# Patient Record
Sex: Female | Born: 1940
Health system: Southern US, Community
[De-identification: ages and names within clinical notes are randomized; demographics above are authoritative.]

## PROBLEM LIST (undated history)

## (undated) DIAGNOSIS — R011 Cardiac murmur, unspecified: Secondary | ICD-10-CM

## (undated) DIAGNOSIS — D509 Iron deficiency anemia, unspecified: Secondary | ICD-10-CM

## (undated) DIAGNOSIS — H269 Unspecified cataract: Secondary | ICD-10-CM

## (undated) DIAGNOSIS — T7840XA Allergy, unspecified, initial encounter: Secondary | ICD-10-CM

## (undated) DIAGNOSIS — I10 Essential (primary) hypertension: Secondary | ICD-10-CM

## (undated) DIAGNOSIS — M1990 Unspecified osteoarthritis, unspecified site: Secondary | ICD-10-CM

## (undated) DIAGNOSIS — M810 Age-related osteoporosis without current pathological fracture: Secondary | ICD-10-CM

## (undated) DIAGNOSIS — E785 Hyperlipidemia, unspecified: Secondary | ICD-10-CM

## (undated) HISTORY — DX: Essential (primary) hypertension: I10

## (undated) HISTORY — DX: Cardiac murmur, unspecified: R01.1

## (undated) HISTORY — DX: Iron deficiency anemia, unspecified: D50.9

## (undated) HISTORY — DX: Unspecified cataract: H26.9

## (undated) HISTORY — PX: COLONOSCOPY: SHX174

## (undated) HISTORY — DX: Allergy, unspecified, initial encounter: T78.40XA

## (undated) HISTORY — DX: Hyperlipidemia, unspecified: E78.5

## (undated) HISTORY — PX: TONSILLECTOMY: SUR1361

## (undated) HISTORY — PX: COSMETIC SURGERY: SHX468

## (undated) HISTORY — DX: Age-related osteoporosis without current pathological fracture: M81.0

## (undated) HISTORY — DX: Unspecified osteoarthritis, unspecified site: M19.90

## (undated) HISTORY — PX: TOTAL KNEE ARTHROPLASTY: SHX125

## (undated) HISTORY — PX: CATARACT EXTRACTION, BILATERAL: SHX1313

---

## 1999-07-16 ENCOUNTER — Other Ambulatory Visit: Admission: RE | Admit: 1999-07-16 | Discharge: 1999-07-16 | Payer: Self-pay | Admitting: Obstetrics and Gynecology

## 2000-07-22 ENCOUNTER — Other Ambulatory Visit: Admission: RE | Admit: 2000-07-22 | Discharge: 2000-07-22 | Payer: Self-pay | Admitting: Obstetrics and Gynecology

## 2001-09-14 ENCOUNTER — Other Ambulatory Visit: Admission: RE | Admit: 2001-09-14 | Discharge: 2001-09-14 | Payer: Self-pay | Admitting: Obstetrics and Gynecology

## 2003-11-19 DIAGNOSIS — E785 Hyperlipidemia, unspecified: Secondary | ICD-10-CM

## 2003-11-19 HISTORY — DX: Hyperlipidemia, unspecified: E78.5

## 2006-04-07 ENCOUNTER — Inpatient Hospital Stay (HOSPITAL_COMMUNITY): Admission: RE | Admit: 2006-04-07 | Discharge: 2006-04-10 | Payer: Self-pay | Admitting: Orthopedic Surgery

## 2006-06-18 ENCOUNTER — Ambulatory Visit (HOSPITAL_COMMUNITY): Admission: RE | Admit: 2006-06-18 | Discharge: 2006-06-18 | Payer: Self-pay | Admitting: Orthopedic Surgery

## 2006-12-24 ENCOUNTER — Inpatient Hospital Stay (HOSPITAL_COMMUNITY): Admission: RE | Admit: 2006-12-24 | Discharge: 2006-12-27 | Payer: Self-pay | Admitting: Orthopedic Surgery

## 2009-04-25 ENCOUNTER — Encounter: Admission: RE | Admit: 2009-04-25 | Discharge: 2009-04-25 | Payer: Self-pay | Admitting: Obstetrics and Gynecology

## 2010-05-31 IMAGING — CR DG THORACOLUMBAR SPINE 2V
4 series · 4 of 4 positions shown · non-contrast
Comparison: Chest x-ray 03/31/2006

CLINICAL DATA: Question moderate wedge fracture.  Possible abnormal
bone density scan.

THORACOLUMBAR SPINE - 2 VIEW

[view not recorded (1 of 4)]
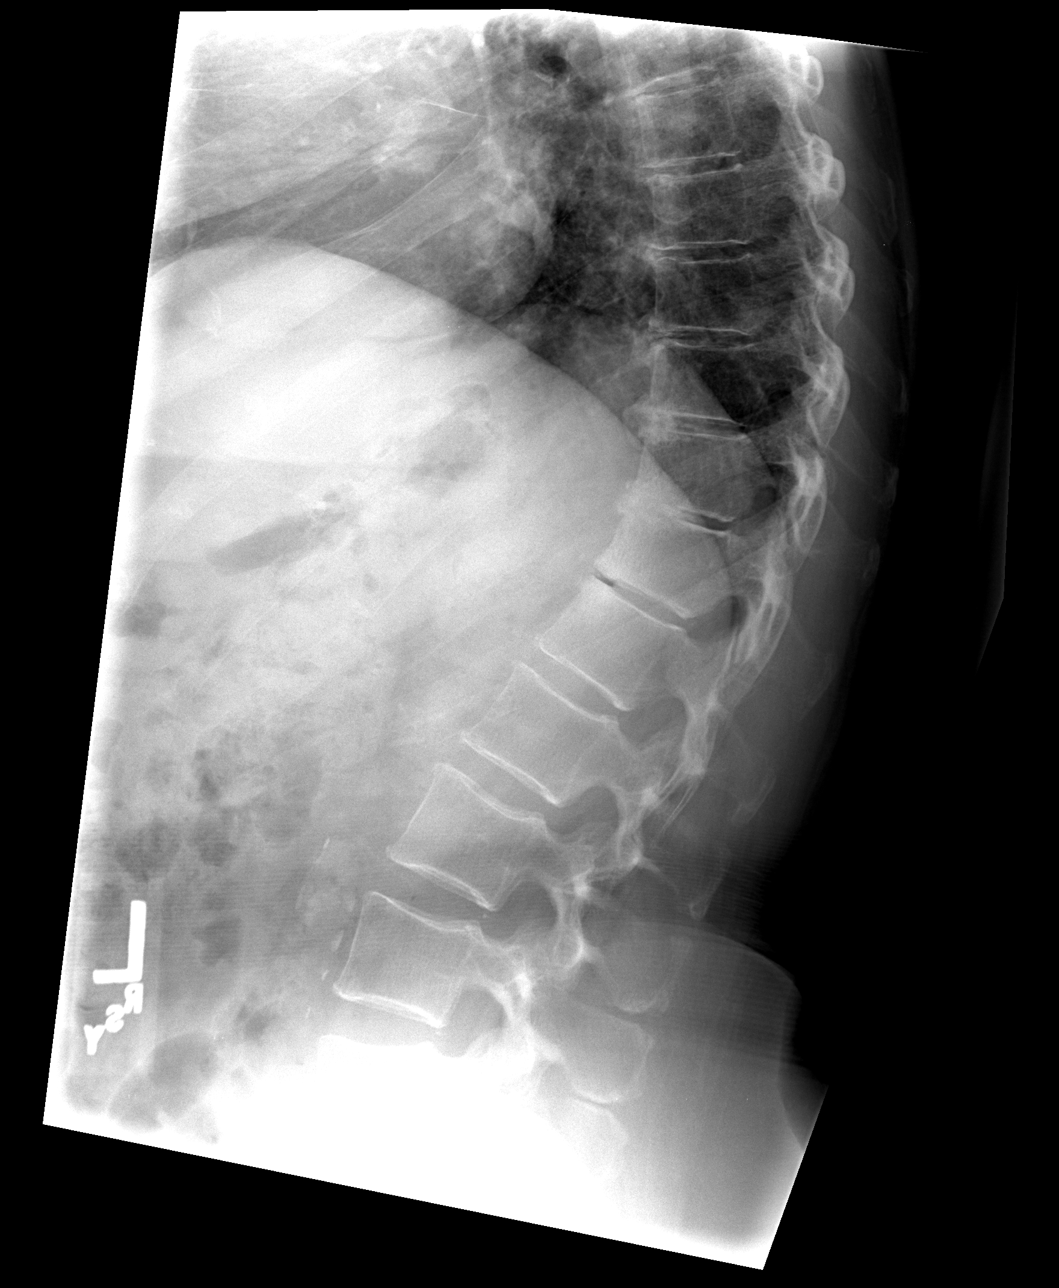

[view not recorded (2 of 4)]
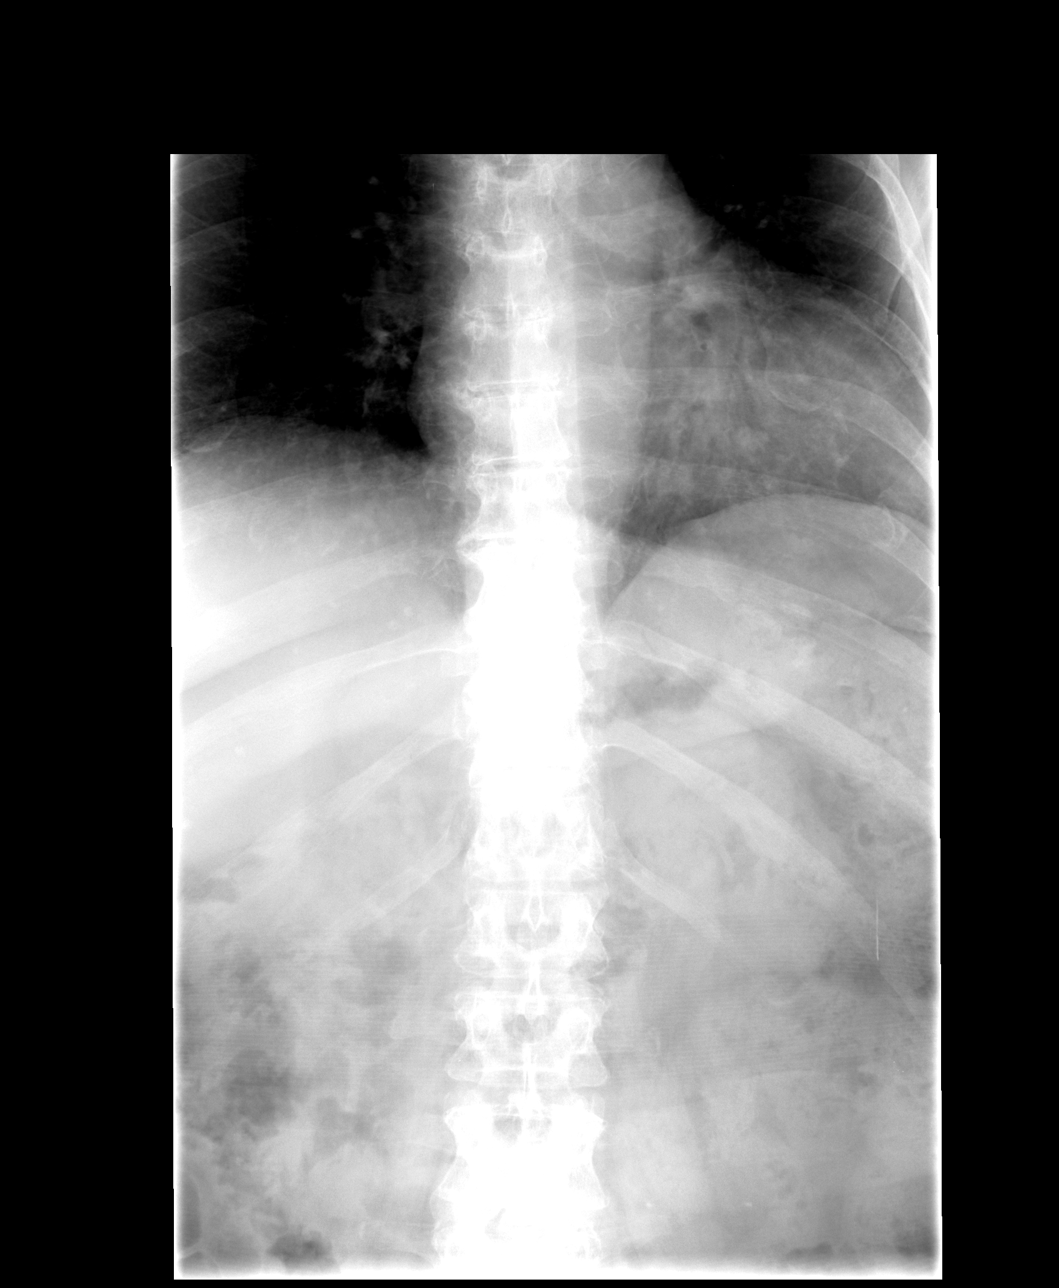

[view not recorded (3 of 4)]
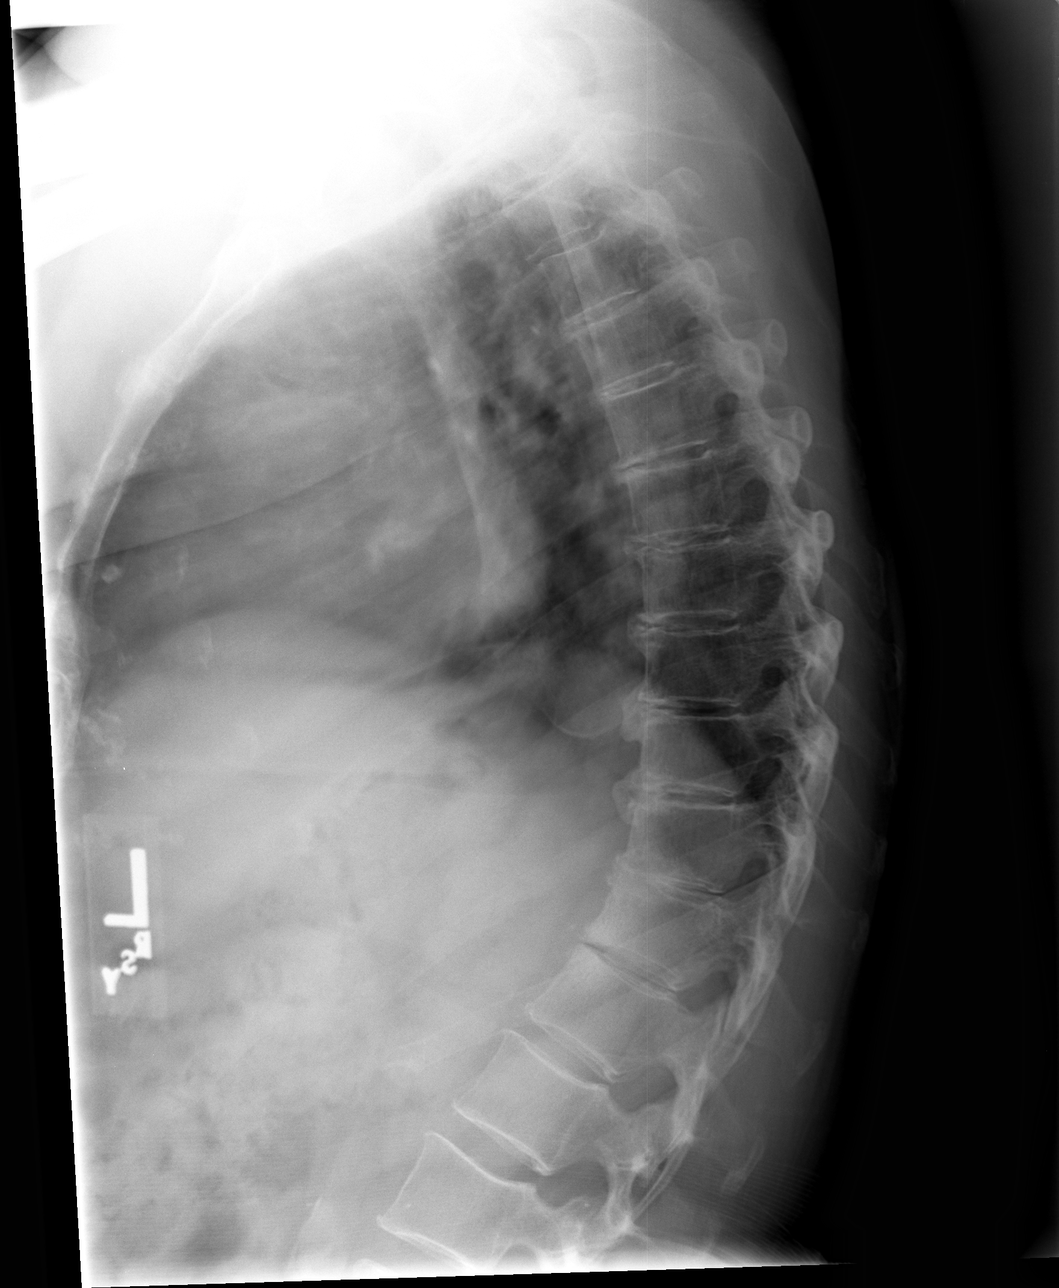

[view not recorded (4 of 4)]
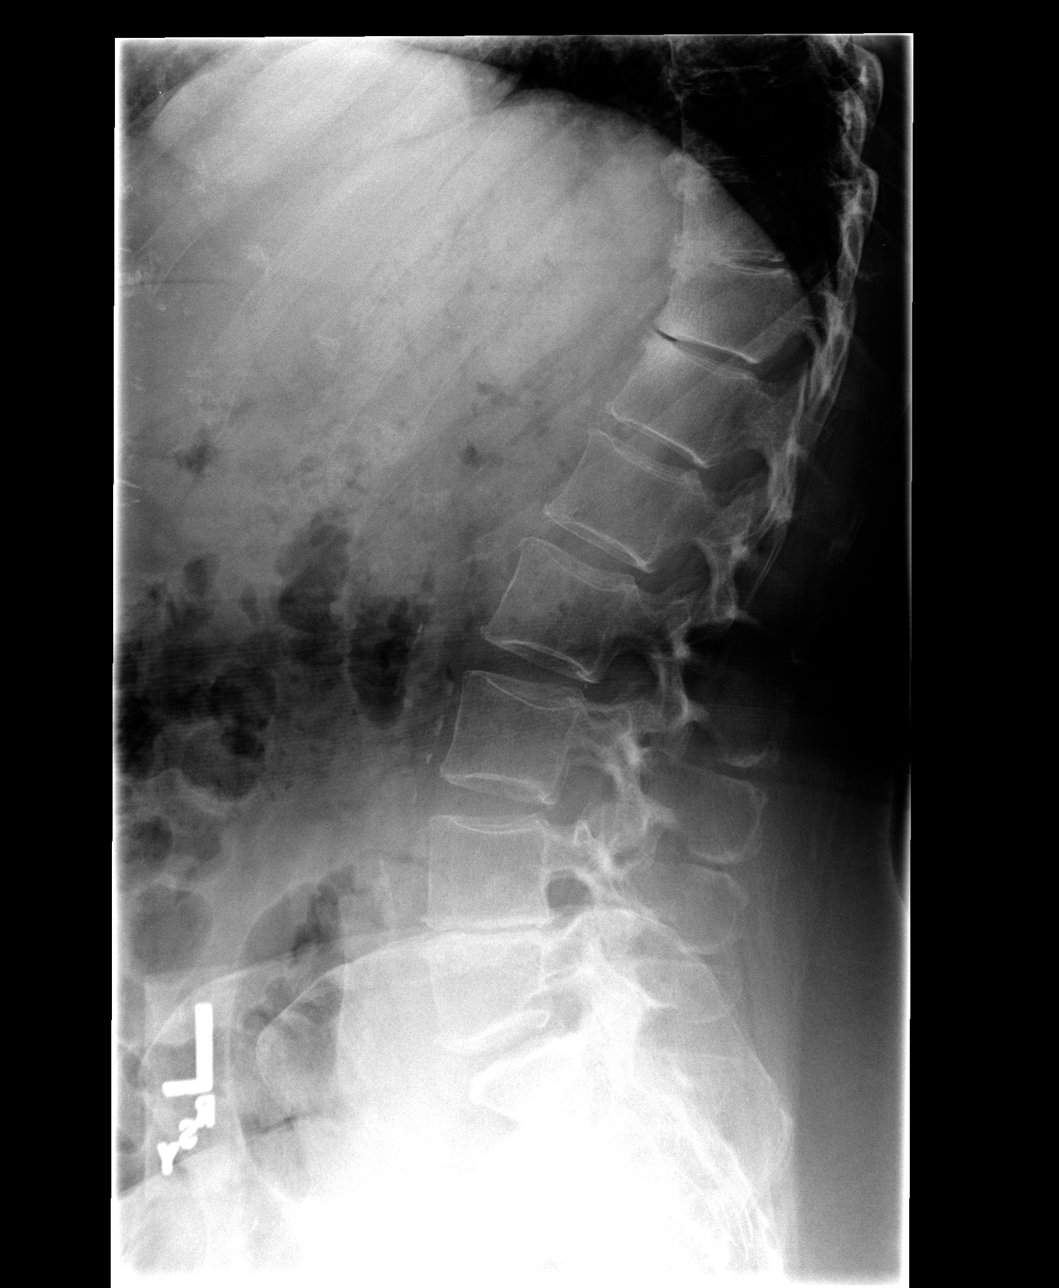

[4 of 4 positions shown; findings below may reference images not displayed]

FINDINGS: Numbering of the thoracic spine was accomplished by
counting from the sacrum.  There is significant disc space
narrowing at L4-5.  There is no evidence for lower thoracic
compression fracture.  There is moderate anterior osteophyte
formation at T9-10, T10-11, and to a lesser degree at T11-12.  No
destructive lesion or acute/chronic compression fracture is seen.
The appearance is not significantly changed from prior chest x-ray.
IMPRESSION: No evidence for T11 compression fracture.

## 2010-11-18 DIAGNOSIS — H269 Unspecified cataract: Secondary | ICD-10-CM

## 2010-11-18 HISTORY — DX: Unspecified cataract: H26.9

## 2011-04-05 NOTE — H&P (Signed)
[name], [name]              ACCOUNT NO.:  [id number]   MEDICAL RECORD NO.:  [id number]          PATIENT TYPE:  INP   LOCATION:  NA                           FACILITY:  [facility]   PHYSICIAN:  Ollen Gross, M.D.    DATE OF BIRTH:  Dec 29, 1940   DATE OF ADMISSION:  12/24/2006  DATE OF DISCHARGE:                              HISTORY & PHYSICAL   Date of office visit, history and physical December 16, 2006.   CHIEF COMPLAINT:  Right knee pain.   HISTORY OF PRESENT ILLNESS:  The patient is a 70 year old female who has  been seen by Dr. Homero Fellers Aluisio for ongoing knee pain.  She has had a  previous left total knee done back in May of 2007 which did require  manipulation.  The knee is getting better.  Unfortunately, the right  knee continues to be progressive with known osteoarthritis.  It was felt  she has reached a point where she would benefit from undergoing knee  replacement.  Risks and benefits discussed.  The patient was  subsequently admitted to the hospital.   ALLERGIES:  PENICILLIN and KEFLEX causes rash.   CURRENT MEDICATIONS:  Lipitor, hydrochlorothiazide, Colace.  She also  takes fiber, baby aspirin, calcium, fish oil, vitamin D, Alli.   PAST MEDICAL HISTORY:  1. Hypertension.  2. Osteoarthritis.  3. Postmenopausal.   PAST SURGICAL HISTORY:  1. Left total knee in May of 2007.  2. Closed manipulation of left total knee.  3. Extensive plastic surgery on face secondary to accident in 1947.  4. Wisdom teeth extraction and tonsils.   SOCIAL HISTORY:  Married.  Works as a IT consultant.  Clinical research associate.  Two  glasses of wine per week.  Two children.   FAMILY HISTORY:  Mother with a history of hypertension, hypothyroidism,  arthritis.  Grandparents with a history of heart disease, diabetes,  stroke, arthritis.  Father with a history of osteoporosis and probable  history of osteoporosis and pneumonia.  History of one aunt with colon  cancer and another aunt with breast  cancer.   REVIEW OF SYSTEMS:  GENERAL:  No fevers, chills or night sweats.  NEURO:  No seizures, syncope or paralysis.  RESPIRATORY:  No shortness of  breath, productive cough or hemoptysis.  CARDIOVASCULAR:  No chest pain,  angina, orthopnea.  GI:  No nausea, vomiting, diarrhea or constipation.  GU:  No dysuria, hematuria or discharge.  MUSCULOSKELETAL:  Right knee.   PHYSICAL EXAMINATION:  VITAL SIGNS:  Pulse 72, respirations 14, blood  pressure 140/78.  GENERAL:  A 70 year old white female well-developed, well-nourished in  no acute distress.  Short in stature, good historian.  Alert, oriented  and cooperative.  HEENT:  Normocephalic and atraumatic.  Pupils round and reactive.  Noted  to wear glasses.  EOMs intact.  Neck supple.  No bruits.  CHEST:  Clear anterior and posterior chest wall.  No rhonchi, rubs or  wheezing.  HEART:  Regular rate and rhythm with occasional ectopic beat.  S1-S2  noted.  ABDOMEN:  Soft, slightly round.  Bowel sounds present.  RECTAL/BREASTS/GENITALIA:  Not done, not  pertinent to present illness.  EXTREMITIES:  Right knee shows passive range of motion of 5 to 120,  marked crepitus, no effusion.   IMPRESSION:  1. Osteoarthritis right knee.  2. Hypertension.  3. Osteoarthritis.  4. Postmenopausal.   PLAN:  The patient is admitted to [facility] to undergo a  right total knee arthroplasty.  Her medical physician, Dr. Smith Mince,  will be notified of the room and should be consulted if needed for  medical assistance with the patient throughout the hospital course.  She  has been seen preoperatively by Dr. Smith Mince and felt to be medically  stable for surgery.      [name], P.A.      Ollen Gross, M.D.  Electronically Signed    ALP/MEDQ  D:  12/23/2006  T:  12/24/2006  Job:  [id number]   cc:   Ollen Gross, M.D.  Fax: [phone]   Talmadge Coventry, M.D.  Fax: [phone]   Patient's chart

## 2011-04-05 NOTE — H&P (Signed)
NAMEPAYTEN, HOBIN              ACCOUNT NO.:  [id number]   MEDICAL RECORD NO.:  [id number]          PATIENT TYPE:  INP   LOCATION:  NA                           FACILITY:  [facility]   PHYSICIAN:  Ollen Gross, M.D.    DATE OF BIRTH:  1941/03/08   DATE OF ADMISSION:  04/07/2006  DATE OF DISCHARGE:                                HISTORY & PHYSICAL   CHIEF COMPLAINT:  Bilateral knee pain.   HISTORY OF PRESENT ILLNESS:  The patient is a 70 year old female who has  been seeing Dr. Lequita Halt for ongoing bilateral knee pain.  The left is more  symptomatic and problematic than the right.  She has had difficulty for  several years now and is interfering with activities she desires to do.  She  is at a stage now when she would like to have something more definitive done  about her knee.  She has been seen in the office. She has significant  arthritic changes in both with bone-on-bone knee patellofemoral  compartments.  Varus deformities are noted bilaterally.  Both are equally  affected, but the left tends to be more problematic and symptomatic.  It was  felt she would benefit from undergoing surgical intervention.  Risks and  benefits discussed.  The patient is subsequently admitted to the hospital .   ALLERGIES:  PENICILLIN and KEFLEX cause rash.   CURRENT MEDICATIONS:  1.  Hydrochlorothiazide 25 mg.  2.  Lipitor 10 mg.  3.  Aspirin 81 mg.  4.  Calcium 1500 mg.   PAST MEDICAL HISTORY:  1.  Hypertension.  2.  Osteoarthritis.  3.  Postmenopausal.   PAST SURGICAL HISTORY:  1.  Extensive facial plastic surgery following accident in 1947.  2.  Tonsillectomy.  3.  Wisdom teeth extraction.   SOCIAL HISTORY:  Married, works as a IT consultant.  Clinical research associate.  Two glasses of  wine per week.  Two children.   FAMILY HISTORY:  Mother with history of hypertension, hypothyroidism, and  arthritis.  Grandparents with history of heart disease, diabetes, stroke,  arthritis.  Father with history of  osteoporosis and deceased secondary to  pneumonia.  Also history of one aunt with colon cancer, another aunt with  breast cancer.   REVIEW OF SYSTEMS:  GENERAL: No fevers, chills, night sweats.  NEUROLOGIC:  No seizures, syncope, paralysis. RESPIRATORY: No shortness of breath,  productive cough, or hemoptysis.  CARDIOVASCULAR: No chest pain, angina,  orthopnea. GI: No nausea, vomiting, diarrhea, constipation. GU: No dysuria,  hematuria, or discharge. MUSCULOSKELETAL: Bilateral knees.   PHYSICAL EXAMINATION:  VITAL SIGNS:  Pulse 76, respirations 16, blood  pressure 152/90.  GENERAL: A 70 year old female, well-nourished, well-developed, short  stature, no acute distress.  She is alert, oriented, cooperative.  HEENT:  Normocephalic and atraumatic.  Pupils equal, round, and reactive to  light.  Oropharynx clear. EOM intact.  NECK: Supple.  CHEST: Clear.  HEART: Regular rate and rhythm without murmur.  ABDOMEN:  Soft, nontender.  Bowel sounds present.  RECTAL/BREASTS/GENITALIA: Not done, not pertinent to present illness.  EXTREMITIES: Left knee: Left knee shows no effusion.  Range  of motion 10 to  120 degrees.  Varus malalignment deformity.  Marked crepitus is noted.   IMPRESSION:  1.  Osteoarthritis bilateral knees, left more symptomatic than right.  2.  Hypertension.  3.  Postmenopausal.   PLAN:  The patient will be admitted to [facility] and undergo a  left total knee angioplasty.  Surgery will be performed by Dr. Ollen Gross.      Alexzandrew L. Julien Girt, P.A.      Ollen Gross, M.D.  Electronically Signed    ALP/MEDQ  D:  04/06/2006  T:  04/06/2006  Job:  [id number]   cc:   Talmadge Coventry, M.D.  Fax: [phone]

## 2011-04-05 NOTE — Discharge Summary (Signed)
NAMEDEVERY, ODWYER              ACCOUNT NO.:  [id number]   MEDICAL RECORD NO.:  [id number]          PATIENT TYPE:  INP   LOCATION:  1503                         FACILITY:  [facility]   PHYSICIAN:  Ollen Gross, M.D.    DATE OF BIRTH:  June 24, 1941   DATE OF ADMISSION:  04/07/2006  DATE OF DISCHARGE:  04/10/2006                                 DISCHARGE SUMMARY   ADMITTING DIAGNOSES:  1.  Osteoarthritis bilateral knees, left more symptomatic than the right.  2.  Hypertension.  3.  Post menopausal.   DISCHARGE DIAGNOSES:  1.  Osteoarthritis, left knee status post left total knee arthroplasty.  2.  Postoperative blood loss anemia.  3.  Postoperative hypokalemia, improved.  4.  Hypertension.  5.  Post menopausal.   PROCEDURE:  On Apr 07, 2006, left total knee surgery, Dr. Lequita Halt.  Assistant:  Alexzandrew L. Julien Girt, P.A.  Spinal anesthesia.   CONSULTATIONS:  None.   BRIEF HISTORY:  Ms. Kroeker is a 70 year old female with end stage  arthritis both knees, left more symptomatic than right, failed nonoperative  management, now presents for total knee.   LABORATORY DATA:  Pre-op CBC:  Hemoglobin 13.1, hematocrit 39.0, normal  white count.  Post-op hemoglobin dropped down to 10.4 with drift down to  9.9.  It came back up with last known H&H 10.2 and 29.  PT PTT pre-op 12.6  and 27, respectively.  INR 0.9.  Serial pro time followed, last known PT INR  20.3 and 1.7.  Chem panel on admission all within normal limits.  Potassium  started at 3.9, drifted down to 3.4, back up to 3.7.  Pre-op UA:  Some small  leukocyte esterase, 0-2 white cells, otherwise negative.  Blood group type  B+.  Two-view chest, Mar 31, 2006, no acute cardiopulmonary disease.   EKG, faxed over May 2007, sinus rhythm, left atrial abnormality, per Dr.  Talmadge Coventry.   HOSPITAL COURSE:  The patient was admitted to [facility],  tolerated procedure well, later transferred to the recovery room on the  orthopedic floor, started on PCA and p.o. analgesics, doing pretty well in  the morning of day 1.  Encouraged pills.  Hemovac drain was pulled.  Started  to get up out of bed with therapy to a chair.  By day 2, she was doing a  little bit better.  Started getting up and ambulating approximately 30 feet  that morning and then eventually 200 feet that afternoon, had an extreme  drop in her potassium likely due to fluids, given some potassium  supplementation, came right back up.  Dressing was changed on day 2.  Incision looked good.  Did so well and she was progressing with therapy by  Apr 10, 2006, and was ready to go home.   DISPOSITION:  Patient discharged home on Apr 10, 2006.   DISCHARGE DIAGNOSES:  1.  Osteoarthritis, left knee status post left total knee arthroplasty.  2.  Postoperative blood loss anemia.  3.  Postoperative hypokalemia, improved.  4.  Hypertension.  5.  Post menopausal.   DISCHARGE MEDICATIONS:  Coumadin,  Percocet, Robaxin.   DIET:  As tolerated.   ACTIVITY:  Home health PT  and home health nursing.  Weightbearing as  tolerated, total knee protocol.   DISPOSITION:  Home.   CONDITION ON DISCHARGE:  Improved.      Alexzandrew L. Julien Girt, P.A.      Ollen Gross, M.D.  Electronically Signed    ALP/MEDQ  D:  05/20/2006  T:  05/20/2006  Job:  [id number]   cc:   Ollen Gross, M.D.  Fax: [phone]   chart   Talmadge Coventry, M.D.  Fax: [phone]

## 2011-04-05 NOTE — Op Note (Signed)
[name], [name]              ACCOUNT NO.:  [id number]   MEDICAL RECORD NO.:  [id number]          PATIENT TYPE:  INP   LOCATION:  0002                         FACILITY:  [facility]   PHYSICIAN:  Ollen Gross, M.D.    DATE OF BIRTH:  10/04/1941   DATE OF PROCEDURE:  12/24/2006  DATE OF DISCHARGE:                               OPERATIVE REPORT   PREOPERATIVE DIAGNOSIS:  Osteoarthritis right knee.   POSTOPERATIVE DIAGNOSIS:  Osteoarthritis right knee.   PROCEDURE:  Right total knee arthroplasty.   SURGEON:  Dr. Homero Fellers Aluisio.   ASSISTANT:  Avel Peace, PA-C.   ANESTHESIA:  Spinal.   BLOOD LOSS:  Minimal.   DRAINS:  Hemovac x1.   TOURNIQUET TIME:  38 minutes at 300 mmHg.   COMPLICATIONS:  None.   CONDITION:  Stable to recovery.   BRIEF CLINICAL NOTE:  [name] is a 70 year old female with end-stage  arthritis of the right knee.  She has had progressively worsening pain  and dysfunction.  She has had a successful left total knee arthroplasty  and presents now for right total knee arthroplasty.   PROCEDURE IN DETAIL:  After successful administration of spinal  anesthetic, a tourniquet was placed high on the right thigh and right  lower extremity prepped and draped in the usual sterile fashion.  Extremity was wrapped in Esmarch, knee flexed, tourniquet inflated to  300 mmHg.  A midline incision was made with a 10 blade through the  subcutaneous tissue to the level of the extensor mechanism. A fresh  blade was used to make a medial parapatellar arthrotomy.  The soft  tissue over the proximal medial tibia is subperiosteally elevated to the  joint line with a knife and into the semimembranosus bursa with a Cobb  elevator.  The soft tissue laterally was elevated with attention being  paid to avoiding the patellar tendon on the tibial tubercle.  The  patella is subluxed laterally, knee flexed 90 degrees, ACL and PCL  removed.  A drill was used to create a starting hole and the  distal  femoral canal was thoroughly irrigated. A 5 degree right valgus  alignment guide is placed and referencing off the posterior condyles  rotation is marked and the block pinned to remove 11 mm off the distal  femur.  Distal femoral resection is made with an oscillating saw. A  sizing block is placed, size 3 is the most appropriate.  Rotation is  marked off the epicondylar axis and then the size 3 cutting block  placed.  The anterior-posterior and chamfer cuts are made.   The tibia is subluxed forward and the menisci are removed.  The  extramedullary tibial alignment guide is placed referencing proximally  at the medial aspect of the tibial tubercle and distally along the  second metatarsal axis and tibial crest.  The block is pinned to remove  10 mm off the non deficient lateral side.  Tibial resection is made with  an oscillating saw. Size 2.5 is the most appropriate tibial component  and then the proximal tibia prepared with the modular drill and keel  punch  for size a 2.5.  Femoral preparation is completed with the  intercondylar cut.   A size 2.5 mobile bearing tibial trial with a 2.5 posterior stabilized  femoral trial and a 10 mm posterior stabilized rotating platform insert  trial is placed.  With the 10, we had a slight bit of hyperextension and  a tiny bit of varus and valgus play. We went up to 12.5 which allowed  for full extension with excellent varus and valgus and anterior and  posterior balance throughout full range of motion.  The patella was then  everted and thickness measured to be 22 mm.  Free hand resection was  taken to 12 mm, 35 template is placed, lug holes are drilled, trial  patella is placed and it tracks normally.  Osteophytes are removed off  the posterior femur with a trial in place.  All trials are removed and  the cut bone surfaces prepared with pulsatile lavage.  Cement is mixed  and once ready for implantation, the size 2.5 mobile bearing tibial   tray, size 3 posterior stabilized femur and 35 patella are cemented into  place and patella is held with a clamp.  A trial 12.5 insert is placed,  knee held in full extension and all extruded cement removed.  Once the  cement is fully hardened then the permanent 12.5 mm posterior stabilized  rotating platform insert is placed into the tibial tray.  The wound was  copiously irrigated with saline solution and the extensor mechanism  closed over a Hemovac drain with interrupted #1 PDS.  Flexion against  gravity is 135 degrees.  The tourniquet is released with a total time of  38 minutes. The subcu was closed with interrupted 2-0 Vicryl and  subcuticular with running 4-0 Monocryl. The drain is hooked to suction.  Incision cleaned and dried and Steri-Strips and a bulky sterile dressing  applied.  She is then awakened and transported to recovery in stable  condition with right knee in the knee immobilizer.      Ollen Gross, M.D.  Electronically Signed     FA/MEDQ  D:  12/24/2006  T:  12/24/2006  Job:  [id number]

## 2011-04-05 NOTE — Discharge Summary (Signed)
[name], [name]              ACCOUNT NO.:  [id number]   MEDICAL RECORD NO.:  [id number]          PATIENT TYPE:  INP   LOCATION:  1509                         FACILITY:  [facility]   PHYSICIAN:  Ollen Gross, M.D.    DATE OF BIRTH:  11-27-1940   DATE OF ADMISSION:  12/24/2006  DATE OF DISCHARGE:  12/27/2006                               DISCHARGE SUMMARY   ADMITTING DIAGNOSES:  1. Osteoarthritis right knee.  2. Hypertension.  3. Osteoarthritis.  4. Postmenopausal.   DISCHARGE DIAGNOSES:  1. Osteoarthritis right knee status post right total knee      arthroplasty.  2. Mild postoperative blood loss anemia.  3. Hypertension.  4. Osteoarthritis.  5. Postmenopausal.   PROCEDURE:  February 6. 2008:  Right total knee.  Surgeon:  Dr. Lequita Halt.  Assistant:  Selena Peace, PA-C.  Performed under spinal anesthesia.   CONSULTS:  None.   BRIEF HISTORY:  [name] is a 70 year old female with end-stage arthritis  of the right knee.  She has had progressive worsening pain and  dysfunction, successful left total knee, and now presents for right  total knee.   LABORATORY DATA:  Preoperative CBC showed hemoglobin of 12.3, hematocrit  of 39.2, and white cell count 5.9.  Chem panel all within normal limits.  Preoperative EKG shows sinus rhythm, left atrial enlargement, confirmed  normal.  Chest x-ray dated Mar 31, 2006:  No active cardiopulmonary  disease.  Serial CBCs were followed.  Postoperative hemoglobin down to  10.3.  Last noted H&H 9.3 and 26.6.  PT/PTT on admission 12.5 and 28  respectively.  INR 0.9.  Serial pro times followed.  Last noted PT/INR  21.1 and 1.7. Serial BMETs were followed.  Electrolytes remained within  normal limits.  Preoperative UA:  Large leukocyte esterase, rare  epithelials, 3-6 white cells and rare bacteria.  Blood group/type B  positive.   HOSPITAL COURSE:  The patient was admitted to [facility].  Taken to the OR and underwent above-stated procedure  without  complication.  Tolerated procedure well, later transferred to the  recovery room and then the orthopedic floor.  She did pretty well  postoperatively, was able to get some sleep on the night of surgery.  She was doing pretty well on the morning of day #1, seen in rounds by  Dr. Lequita Halt.  Hemovac drain placed at the time of surgery was pulled  without difficulty.  She had excellent urinary output, had a little bit  elevated serum glucose which was felt to be due to the D5; that was  removed.  Started getting up out of bed with physical therapy.  By day  #2 she was doing really well.  She was in better spirits, looked  comfortable.  Hemoglobin was down to 9.4 so she was placed on some iron.  Dressing was changed.  Incision looked good.  She actually did very well  with physical therapy and got up to ambulating approximately 200 feet.  She was so well she was seen in rounds by the orthopedic weekend  coverage, tolerating her medications, and was discharged home the  following day.   DISCHARGE PLAN:  1. The patient discharged home on December 27, 2006.  2. Discharge diagnoses:  Please see above.  3. Discharge medications:  Percocet, Robaxin, Coumadin.  4. Diet:  Resume home diet.  5. Activity:  Weightbearing as tolerated.  Total knee protocol.  Home      health PT, home health nursing.  6. Followup 2 weeks.   DISPOSITION:  Home.   CONDITION UPON DISCHARGE:  Improved.      Alexzandrew L. Julien Girt, P.A.      Ollen Gross, M.D.  Electronically Signed    ALP/MEDQ  D:  01/29/2007  T:  01/30/2007  Job:  [id number]   cc:   Talmadge Coventry, M.D.  Fax: [phone]

## 2011-04-05 NOTE — Op Note (Signed)
NAMESIMRA, FIEBIG              ACCOUNT NO.:  [id number]   MEDICAL RECORD NO.:  [id number]          PATIENT TYPE:  INP   LOCATION:  0006                         FACILITY:  [facility]   PHYSICIAN:  Ollen Gross, M.D.    DATE OF BIRTH:  February 18, 1941   DATE OF PROCEDURE:  04/07/2006  DATE OF DISCHARGE:                                 OPERATIVE REPORT   PREOPERATIVE DIAGNOSIS:  Osteoarthritis left knee.   POSTOPERATIVE DIAGNOSIS:  Osteoarthritis left knee.   PROCEDURE:  Left total knee arthroplasty.   SURGEON:  Ollen Gross, M.D.   ASSISTANT:  Alexzandrew L. Julien Girt, P.A.   ANESTHESIA:  Spinal.   ESTIMATED BLOOD LOSS:  Minimal.   DRAINS:  Hemovac x 1.   TOURNIQUET TIME:  39 minutes at 300 mmHg.   COMPLICATIONS:  None.  Condition stable to recovery.   BRIEF CLINICAL NOTE:  Ms. Fehrman is a 70 year old female with end-stage  osteoarthritis both knees, left more symptomatic than the right.  She has  failed nonoperative management and presents now for total knee arthroplasty.   PROCEDURE IN DETAIL:  After successful initiation of spinal anesthetic, a  tourniquet placed on the left thigh and left lower extremity prepped and  draped in usual sterile fashion.  Extremities wrapped in Esmarch, knee  flexed and tourniquet inflated 3 mmHg.  Standard midline incision made with  10 blade through subcutaneous tissue to the level of the extensor mechanism.  Fresh blade is used to make a medial parapatellar arthrotomy, then the soft  tissue of the proximal medial tibia subperiosteally elevated to the joint  line with the knife and into the semimembranosus bursa with a Cobb elevator.  Soft tissue of the proximal lateral tibia is elevated with attention being  paid to avoid the patellar tendon on tibial tubercle.  Patella was everted,  knee flexed 90 degrees, ACL and PCL removed.  Drill was used to create a  starting on the distal femur and canal was thoroughly irrigated.  5 degrees  left  valgus alignment guide was placed and referencing off the posterior  condyles rotation is marked and the block pinned to remove 10 mm off the  distal femur.  Distal femoral resection is made with an oscillating saw.  Sizing block is placed and a size 3 is most appropriate.  Size 3 cutting  block is placed with a rotation marked off the epicondylar axis.  The  anterior posterior and chamfer cuts were made.   Tibia subluxed forward and the menisci are removed.  Extramedullary tibial  alignment guide is placed referencing proximally at the medial aspect of the  tibial tubercle and distally along the second metatarsal axis and tibial  crest.  The block is pinned to remove 10 mm off the non deficient lateral  side.  Tibial resection is made with an oscillating saw.  The size 3 is most  appropriate tibial component and the proximal tibia is then prepared with  the modular drill and keel punch for size 3.  Femoral preparation is then  completed with the intercondylar cut.   Size 3 mobile bearing tibial  trial with a size 3 posterior stabilized  femoral trial and a 10 mm posterior stabilized rotating platform insert  trial are placed.  With the 10, full extension was achieved with excellent  varus and valgus balance throughout full range of motion.  The patella was  everted, thickness measured to the 20 mm.  Freehand resection taken to 11  mm, 38 template is placed, lug holes were drilled, trial patella was placed  and tracks normally.  The osteophytes then removed from posterior femur with  the trial in place.  All trials removed and the cut bone surfaces prepared  with pulsatile lavage.  Cement was mixed and once ready for implantation,  the size 3 mobile bearing tibial tray, size 3 posterior stabilized femur and  38 patella are cemented into place.  The patella was held with the clamp.  Trial 10 mm insert was placed, knee held full extension,  all extruded  cement removed.  Once cement fully  hardened, then the permanent 10 mm  posterior stabilized rotating platform insert is placed in the tibial tray.  Wound was copiously irrigated with saline solution and the extensor  mechanism closed over Hemovac drain with interrupted #1 PDS.  Flexion  against gravity to 130 degrees.  Tourniquet released.  Total time of 39  minutes.  Subcu closed interrupted 2-0 Vicryl, subcuticular running 4-0  Monocryl.  The drains hooked to suction.  Incision cleaned and dried and  Steri-Strips and bulky sterile dressing applied.  The patient's placed into  a knee immobilizer, awakened, transferred to recovery in stable condition.      Ollen Gross, M.D.  Electronically Signed     FA/MEDQ  D:  04/07/2006  T:  04/07/2006  Job:  [id number]

## 2011-04-05 NOTE — Op Note (Signed)
[name], [name]              ACCOUNT NO.:  [id number]   MEDICAL RECORD NO.:  [id number]          PATIENT TYPE:  AMB   LOCATION:  DAY                          FACILITY:  [facility]   PHYSICIAN:  Ollen Gross, M.D.    DATE OF BIRTH:  05/30/1941   DATE OF PROCEDURE:  06/18/2006  DATE OF DISCHARGE:                                 OPERATIVE REPORT   PREOPERATIVE DIAGNOSIS:  Arthrofibrosis, left knee.   POSTOPERATIVE DIAGNOSIS:  Arthrofibrosis, left knee.   PROCEDURE:  Left knee closed manipulation.   SURGEON:  Ollen Gross, M.D.   ASSISTANT:  None.   ANESTHESIA:  General.   BLOOD LOSS:  None.   COMPLICATIONS:  None.   PRE-MANIPULATION RANGE OF MOTION:  15-90.   POST-MANIPULATION RANGE OF MOTION:  5-130.   CONDITION:  Stable, to Recovery.   BRIEF CLINICAL NOTE:  Ms. Peaster is a 70 year old female with left total  knee arthroplasty approximately 3 months ago.  She has had significant  difficulties with both flexion and extension and no progress in physical  therapy over the past month.  She has a range of motion of about 15-90.  She  presents now for closed manipulation.   PROCEDURE IN DETAIL:  After successful administration of general anesthetic,  exam under anesthesia showed range 15-90.  I placed my chest up against her  proximal tibia and then I flexed her knee, audibly hearing the adhesions  lyse.  I was able to easily flex her to 130 degrees.  I then mobilized her  patella and increased the patella mobility markedly.  We extended the knee  to within 5 degrees of full extension.  It would not go beyond there.  I  then placed her through a range of motion again and she was easily going  from 5-130.  We subsequently awakened her and transported her to Recovery in  stable condition.     Ollen Gross, M.D.  Electronically Signed    FA/MEDQ  D:  06/18/2006  T:  06/19/2006  Job:  [id number]

## 2011-11-27 DIAGNOSIS — Z124 Encounter for screening for malignant neoplasm of cervix: Secondary | ICD-10-CM | POA: Diagnosis not present

## 2011-11-27 DIAGNOSIS — M949 Disorder of cartilage, unspecified: Secondary | ICD-10-CM | POA: Diagnosis not present

## 2011-11-27 DIAGNOSIS — M899 Disorder of bone, unspecified: Secondary | ICD-10-CM | POA: Diagnosis not present

## 2011-11-27 DIAGNOSIS — Z01419 Encounter for gynecological examination (general) (routine) without abnormal findings: Secondary | ICD-10-CM | POA: Diagnosis not present

## 2011-11-27 DIAGNOSIS — N951 Menopausal and female climacteric states: Secondary | ICD-10-CM | POA: Diagnosis not present

## 2011-12-30 DIAGNOSIS — M810 Age-related osteoporosis without current pathological fracture: Secondary | ICD-10-CM | POA: Diagnosis not present

## 2012-03-03 DIAGNOSIS — M899 Disorder of bone, unspecified: Secondary | ICD-10-CM | POA: Diagnosis not present

## 2012-03-03 DIAGNOSIS — B354 Tinea corporis: Secondary | ICD-10-CM | POA: Diagnosis not present

## 2012-03-03 DIAGNOSIS — M949 Disorder of cartilage, unspecified: Secondary | ICD-10-CM | POA: Diagnosis not present

## 2012-03-23 DIAGNOSIS — Z961 Presence of intraocular lens: Secondary | ICD-10-CM | POA: Diagnosis not present

## 2012-03-23 DIAGNOSIS — H35379 Puckering of macula, unspecified eye: Secondary | ICD-10-CM | POA: Diagnosis not present

## 2012-08-04 DIAGNOSIS — R7301 Impaired fasting glucose: Secondary | ICD-10-CM | POA: Diagnosis not present

## 2012-08-04 DIAGNOSIS — M899 Disorder of bone, unspecified: Secondary | ICD-10-CM | POA: Diagnosis not present

## 2012-08-04 DIAGNOSIS — R82998 Other abnormal findings in urine: Secondary | ICD-10-CM | POA: Diagnosis not present

## 2012-08-04 DIAGNOSIS — I10 Essential (primary) hypertension: Secondary | ICD-10-CM | POA: Diagnosis not present

## 2012-08-11 DIAGNOSIS — Z0000 Encounter for general adult medical examination without abnormal findings: Secondary | ICD-10-CM | POA: Diagnosis not present

## 2012-08-11 DIAGNOSIS — Z23 Encounter for immunization: Secondary | ICD-10-CM | POA: Diagnosis not present

## 2012-08-11 DIAGNOSIS — M899 Disorder of bone, unspecified: Secondary | ICD-10-CM | POA: Diagnosis not present

## 2012-08-11 DIAGNOSIS — M949 Disorder of cartilage, unspecified: Secondary | ICD-10-CM | POA: Diagnosis not present

## 2012-08-11 DIAGNOSIS — E785 Hyperlipidemia, unspecified: Secondary | ICD-10-CM | POA: Diagnosis not present

## 2012-08-11 DIAGNOSIS — I10 Essential (primary) hypertension: Secondary | ICD-10-CM | POA: Diagnosis not present

## 2012-08-13 DIAGNOSIS — Z1212 Encounter for screening for malignant neoplasm of rectum: Secondary | ICD-10-CM | POA: Diagnosis not present

## 2012-09-30 DIAGNOSIS — L821 Other seborrheic keratosis: Secondary | ICD-10-CM | POA: Diagnosis not present

## 2012-09-30 DIAGNOSIS — D1801 Hemangioma of skin and subcutaneous tissue: Secondary | ICD-10-CM | POA: Diagnosis not present

## 2012-09-30 DIAGNOSIS — L259 Unspecified contact dermatitis, unspecified cause: Secondary | ICD-10-CM | POA: Diagnosis not present

## 2012-09-30 DIAGNOSIS — L739 Follicular disorder, unspecified: Secondary | ICD-10-CM | POA: Diagnosis not present

## 2013-01-26 DIAGNOSIS — Z124 Encounter for screening for malignant neoplasm of cervix: Secondary | ICD-10-CM | POA: Diagnosis not present

## 2013-01-26 DIAGNOSIS — Z01419 Encounter for gynecological examination (general) (routine) without abnormal findings: Secondary | ICD-10-CM | POA: Diagnosis not present

## 2013-01-26 DIAGNOSIS — M818 Other osteoporosis without current pathological fracture: Secondary | ICD-10-CM | POA: Diagnosis not present

## 2013-01-26 DIAGNOSIS — N951 Menopausal and female climacteric states: Secondary | ICD-10-CM | POA: Diagnosis not present

## 2013-06-21 DIAGNOSIS — Z961 Presence of intraocular lens: Secondary | ICD-10-CM | POA: Diagnosis not present

## 2013-06-21 DIAGNOSIS — H35379 Puckering of macula, unspecified eye: Secondary | ICD-10-CM | POA: Diagnosis not present

## 2013-08-05 DIAGNOSIS — Z23 Encounter for immunization: Secondary | ICD-10-CM | POA: Diagnosis not present

## 2013-08-11 DIAGNOSIS — M899 Disorder of bone, unspecified: Secondary | ICD-10-CM | POA: Diagnosis not present

## 2013-08-11 DIAGNOSIS — R7301 Impaired fasting glucose: Secondary | ICD-10-CM | POA: Diagnosis not present

## 2013-08-11 DIAGNOSIS — E785 Hyperlipidemia, unspecified: Secondary | ICD-10-CM | POA: Diagnosis not present

## 2013-08-11 DIAGNOSIS — I10 Essential (primary) hypertension: Secondary | ICD-10-CM | POA: Diagnosis not present

## 2013-08-18 DIAGNOSIS — E669 Obesity, unspecified: Secondary | ICD-10-CM | POA: Diagnosis not present

## 2013-08-18 DIAGNOSIS — R7301 Impaired fasting glucose: Secondary | ICD-10-CM | POA: Diagnosis not present

## 2013-08-18 DIAGNOSIS — Z23 Encounter for immunization: Secondary | ICD-10-CM | POA: Diagnosis not present

## 2013-08-18 DIAGNOSIS — Z1331 Encounter for screening for depression: Secondary | ICD-10-CM | POA: Diagnosis not present

## 2013-08-18 DIAGNOSIS — I10 Essential (primary) hypertension: Secondary | ICD-10-CM | POA: Diagnosis not present

## 2013-08-18 DIAGNOSIS — E785 Hyperlipidemia, unspecified: Secondary | ICD-10-CM | POA: Diagnosis not present

## 2013-08-18 DIAGNOSIS — Z0000 Encounter for general adult medical examination without abnormal findings: Secondary | ICD-10-CM | POA: Diagnosis not present

## 2013-08-18 DIAGNOSIS — IMO0002 Reserved for concepts with insufficient information to code with codable children: Secondary | ICD-10-CM | POA: Diagnosis not present

## 2013-08-18 DIAGNOSIS — M899 Disorder of bone, unspecified: Secondary | ICD-10-CM | POA: Diagnosis not present

## 2013-08-26 DIAGNOSIS — Z1212 Encounter for screening for malignant neoplasm of rectum: Secondary | ICD-10-CM | POA: Diagnosis not present

## 2013-09-07 DIAGNOSIS — Z23 Encounter for immunization: Secondary | ICD-10-CM | POA: Diagnosis not present

## 2013-09-10 DIAGNOSIS — Z1231 Encounter for screening mammogram for malignant neoplasm of breast: Secondary | ICD-10-CM | POA: Diagnosis not present

## 2013-09-10 DIAGNOSIS — M899 Disorder of bone, unspecified: Secondary | ICD-10-CM | POA: Diagnosis not present

## 2013-09-29 DIAGNOSIS — L821 Other seborrheic keratosis: Secondary | ICD-10-CM | POA: Diagnosis not present

## 2013-09-29 DIAGNOSIS — L608 Other nail disorders: Secondary | ICD-10-CM | POA: Diagnosis not present

## 2013-09-29 DIAGNOSIS — L739 Follicular disorder, unspecified: Secondary | ICD-10-CM | POA: Diagnosis not present

## 2013-10-27 ENCOUNTER — Encounter: Payer: Self-pay | Admitting: Internal Medicine

## 2014-01-17 ENCOUNTER — Encounter: Payer: Self-pay | Admitting: Internal Medicine

## 2014-02-23 ENCOUNTER — Ambulatory Visit (AMBULATORY_SURGERY_CENTER): Payer: Self-pay

## 2014-02-23 VITALS — Ht 63.0 in | Wt 173.0 lb

## 2014-02-23 DIAGNOSIS — Z800 Family history of malignant neoplasm of digestive organs: Secondary | ICD-10-CM

## 2014-02-23 MED ORDER — MOVIPREP 100 G PO SOLR: 1.0000 | Freq: Once | ORAL | Status: DC

## 2014-03-18 ENCOUNTER — Encounter: Payer: Self-pay | Admitting: Internal Medicine

## 2014-03-18 ENCOUNTER — Ambulatory Visit (AMBULATORY_SURGERY_CENTER): Payer: Medicare Other | Admitting: Internal Medicine

## 2014-03-18 VITALS — BP 134/84 | HR 68 | Temp 97.6°F | Resp 17 | Ht 63.0 in | Wt 173.0 lb

## 2014-03-18 DIAGNOSIS — Z1211 Encounter for screening for malignant neoplasm of colon: Secondary | ICD-10-CM | POA: Diagnosis not present

## 2014-03-18 DIAGNOSIS — Z800 Family history of malignant neoplasm of digestive organs: Secondary | ICD-10-CM | POA: Diagnosis not present

## 2014-03-18 DIAGNOSIS — D126 Benign neoplasm of colon, unspecified: Secondary | ICD-10-CM

## 2014-03-18 DIAGNOSIS — I10 Essential (primary) hypertension: Secondary | ICD-10-CM | POA: Diagnosis not present

## 2014-03-18 MED ORDER — SODIUM CHLORIDE 0.9 % IV SOLN: 500.0000 mL | INTRAVENOUS | Status: DC

## 2014-03-18 NOTE — Patient Instructions (Signed)
Discharge instructions given with verbal understanding. Handout on polyps. Resume previous medications. YOU HAD AN ENDOSCOPIC PROCEDURE TODAY AT THE [facility]: Refer to the procedure report that was given to you for any specific questions about what was found during the examination.  If the procedure report does not answer your questions, please call your gastroenterologist to clarify.  If you requested that your care partner not be given the details of your procedure findings, then the procedure report has been included in a sealed envelope for you to review at your convenience later.  YOU SHOULD EXPECT: Some feelings of bloating in the abdomen. Passage of more gas than usual.  Walking can help get rid of the air that was put into your GI tract during the procedure and reduce the bloating. If you had a lower endoscopy (such as a colonoscopy or flexible sigmoidoscopy) you may notice spotting of blood in your stool or on the toilet paper. If you underwent a bowel prep for your procedure, then you may not have a normal bowel movement for a few days.  DIET: Your first meal following the procedure should be a light meal and then it is ok to progress to your normal diet.  A half-sandwich or bowl of soup is an example of a good first meal.  Heavy or fried foods are harder to digest and may make you feel nauseous or bloated.  Likewise meals heavy in dairy and vegetables can cause extra gas to form and this can also increase the bloating.  Drink plenty of fluids but you should avoid alcoholic beverages for 24 hours.  ACTIVITY: Your care partner should take you home directly after the procedure.  You should plan to take it easy, moving slowly for the rest of the day.  You can resume normal activity the day after the procedure however you should NOT DRIVE or use heavy machinery for 24 hours (because of the sedation medicines used during the test).    SYMPTOMS TO REPORT IMMEDIATELY: A  gastroenterologist can be reached at any hour.  During normal business hours, 8:30 AM to 5:00 PM Monday through Friday, call [phone].  After hours and on weekends, please call the GI answering service at [phone] who will take a message and have the physician on call contact you.   Following lower endoscopy (colonoscopy or flexible sigmoidoscopy):  Excessive amounts of blood in the stool  Significant tenderness or worsening of abdominal pains  Swelling of the abdomen that is new, acute  Fever of 100F or higher  FOLLOW UP: If any biopsies were taken you will be contacted by phone or by letter within the next 1-3 weeks.  Call your gastroenterologist if you have not heard about the biopsies in 3 weeks.  Our staff will call the home number listed on your records the next business day following your procedure to check on you and address any questions or concerns that you may have at that time regarding the information given to you following your procedure. This is a courtesy call and so if there is no answer at the home number and we have not heard from you through the emergency physician on call, we will assume that you have returned to your regular daily activities without incident.  SIGNATURES/CONFIDENTIALITY: You and/or your care partner have signed paperwork which will be entered into your electronic medical record.  These signatures attest to the fact that that the information above on your After Visit Summary has been   reviewed and is understood.  Full responsibility of the confidentiality of this discharge information lies with you and/or your care-partner. 

## 2014-03-18 NOTE — Progress Notes (Signed)
Called to room to assist during endoscopic procedure.  Patient ID and intended procedure confirmed with present staff. Received instructions for my participation in the procedure from the performing physician.  

## 2014-03-18 NOTE — Progress Notes (Signed)
Report to pacu rn, vss, bbs=clear 

## 2014-03-18 NOTE — Op Note (Signed)
[facility]  [address] [place], [address]   COLONOSCOPY PROCEDURE REPORT  PATIENT: Selena, Gonzales  MR#: [id number] BIRTHDATE: May 14, 1941 , 53  yrs. old GENDER: Female ENDOSCOPIST: Lafayette Dragon, MD REFERRED IR:CVELFYBO Dione Housekeeper, M.D. PROCEDURE DATE:  03/18/2014 PROCEDURE:   Colonoscopy with cold biopsy polypectomy First Screening Colonoscopy - Avg.  risk and is 50 yrs.  old or older - No.  Prior Negative Screening - Now for repeat screening. 10 or more years since last screening  History of Adenoma - Now for follow-up colonoscopy & has been > or = to 3 yrs.  N/A  Polyps Removed Today? Yes. ASA CLASS:   Class II INDICATIONS:Average risk patient for colon cancer and last colonoscopy in 2005 was normal.  Positive family history of colon cancer in maternal aunt. MEDICATIONS: MAC sedation, administered by CRNA and propofol (Diprivan) 300mg  IV  DESCRIPTION OF PROCEDURE:   After the risks benefits and alternatives of the procedure were thoroughly explained, informed consent was obtained.  A digital rectal exam revealed no abnormalities of the rectum.   The LB [id number]  endoscope was introduced through the anus and advanced to the cecum, which was identified by both the appendix and ileocecal valve. No adverse events experienced.   The quality of the prep was good, using MoviPrep  The instrument was then slowly withdrawn as the colon was fully examined.      COLON FINDINGS: A polypoid shaped semi-pedunculated polyp ranging between 3-84mm in size was found in the sigmoid colon.  A polypectomy was performed with cold forceps.  The resection was complete and the polyp tissue was completely retrieved. Retroflexed views revealed no abnormalities. The time to cecum=15 minutes 28 seconds.  Withdrawal time=9 minutes 31 seconds.  The scope was withdrawn and the procedure completed. COMPLICATIONS: There were no complications.  ENDOSCOPIC  IMPRESSION: Semi-pedunculated polyp ranging between 3-45mm in size was found in the sigmoid colon; polypectomy was performed with cold forceps  RECOMMENDATIONS: 1.  Await pathology results 2.  high fiber diet Recall colonoscopy pending path report   eSigned:  Lafayette Dragon, MD 03/18/2014 8:39 AM   cc:   PATIENT NAME:  Selena, Gonzales MR#: [id number]

## 2014-03-18 NOTE — Progress Notes (Signed)
Pt is a HIPAA pt.  Dr. Olevia Perches speaks with pt and then goes to lobby to speak with pt's husband.  Dr. Olevia Perches then brings husband back to the RR to be with the pt herself.  Husband at bedside at this time.  Given that pt stated desire to be a HIPAA pt, report placed into sealed envelope for pt.  Instructions given re: eating and follow up numbers given to pt and told to look over discharge instructions.  Pt told to call back if she has any questions

## 2014-03-21 ENCOUNTER — Telehealth: Payer: Self-pay | Admitting: *Deleted

## 2014-03-21 NOTE — Telephone Encounter (Signed)
  Follow up Call-  Call back number 03/18/2014  Post procedure Call Back phone  # [phone] ext [phone]  Permission to leave phone message Yes     Patient questions:  Left message to call us if necessary.

## 2014-03-23 ENCOUNTER — Encounter: Payer: Self-pay | Admitting: Internal Medicine

## 2014-03-24 ENCOUNTER — Encounter: Payer: Self-pay | Admitting: *Deleted

## 2014-04-19 DIAGNOSIS — Z01419 Encounter for gynecological examination (general) (routine) without abnormal findings: Secondary | ICD-10-CM | POA: Diagnosis not present

## 2014-04-19 DIAGNOSIS — Z124 Encounter for screening for malignant neoplasm of cervix: Secondary | ICD-10-CM | POA: Diagnosis not present

## 2014-08-15 DIAGNOSIS — M949 Disorder of cartilage, unspecified: Secondary | ICD-10-CM | POA: Diagnosis not present

## 2014-08-15 DIAGNOSIS — R7301 Impaired fasting glucose: Secondary | ICD-10-CM | POA: Diagnosis not present

## 2014-08-15 DIAGNOSIS — I10 Essential (primary) hypertension: Secondary | ICD-10-CM | POA: Diagnosis not present

## 2014-08-15 DIAGNOSIS — E785 Hyperlipidemia, unspecified: Secondary | ICD-10-CM | POA: Diagnosis not present

## 2014-08-15 DIAGNOSIS — Z79899 Other long term (current) drug therapy: Secondary | ICD-10-CM | POA: Diagnosis not present

## 2014-08-15 DIAGNOSIS — M899 Disorder of bone, unspecified: Secondary | ICD-10-CM | POA: Diagnosis not present

## 2014-08-16 DIAGNOSIS — I10 Essential (primary) hypertension: Secondary | ICD-10-CM | POA: Diagnosis not present

## 2014-08-22 DIAGNOSIS — Z1389 Encounter for screening for other disorder: Secondary | ICD-10-CM | POA: Diagnosis not present

## 2014-08-22 DIAGNOSIS — T148 Other injury of unspecified body region: Secondary | ICD-10-CM | POA: Diagnosis not present

## 2014-08-22 DIAGNOSIS — R7301 Impaired fasting glucose: Secondary | ICD-10-CM | POA: Diagnosis not present

## 2014-08-22 DIAGNOSIS — E785 Hyperlipidemia, unspecified: Secondary | ICD-10-CM | POA: Diagnosis not present

## 2014-08-22 DIAGNOSIS — Z0000 Encounter for general adult medical examination without abnormal findings: Secondary | ICD-10-CM | POA: Diagnosis not present

## 2014-08-22 DIAGNOSIS — E669 Obesity, unspecified: Secondary | ICD-10-CM | POA: Diagnosis not present

## 2014-08-22 DIAGNOSIS — M8580 Other specified disorders of bone density and structure, unspecified site: Secondary | ICD-10-CM | POA: Diagnosis not present

## 2014-08-22 DIAGNOSIS — I10 Essential (primary) hypertension: Secondary | ICD-10-CM | POA: Diagnosis not present

## 2014-08-22 DIAGNOSIS — Z23 Encounter for immunization: Secondary | ICD-10-CM | POA: Diagnosis not present

## 2014-08-23 DIAGNOSIS — Z1212 Encounter for screening for malignant neoplasm of rectum: Secondary | ICD-10-CM | POA: Diagnosis not present

## 2014-09-20 DIAGNOSIS — Z1231 Encounter for screening mammogram for malignant neoplasm of breast: Secondary | ICD-10-CM | POA: Diagnosis not present

## 2014-09-20 DIAGNOSIS — Z803 Family history of malignant neoplasm of breast: Secondary | ICD-10-CM | POA: Diagnosis not present

## 2014-10-03 DIAGNOSIS — Z961 Presence of intraocular lens: Secondary | ICD-10-CM | POA: Diagnosis not present

## 2014-10-03 DIAGNOSIS — H35371 Puckering of macula, right eye: Secondary | ICD-10-CM | POA: Diagnosis not present

## 2014-10-31 DIAGNOSIS — D1801 Hemangioma of skin and subcutaneous tissue: Secondary | ICD-10-CM | POA: Diagnosis not present

## 2014-10-31 DIAGNOSIS — L814 Other melanin hyperpigmentation: Secondary | ICD-10-CM | POA: Diagnosis not present

## 2014-10-31 DIAGNOSIS — L738 Other specified follicular disorders: Secondary | ICD-10-CM | POA: Diagnosis not present

## 2014-10-31 DIAGNOSIS — L821 Other seborrheic keratosis: Secondary | ICD-10-CM | POA: Diagnosis not present

## 2014-11-16 ENCOUNTER — Telehealth: Payer: Self-pay | Admitting: Internal Medicine

## 2014-11-16 MED ORDER — DIPHENOXYLATE-ATROPINE 2.5-0.025 MG PO TABS: ORAL_TABLET | ORAL | Status: DC

## 2014-11-16 NOTE — Telephone Encounter (Signed)
Please send Lomotil #40, 1 or 2 po up to tid prn diarrhea, 1 refill. She can share it with her husband who requwested the same thing, but they can use the same prescription. For possible travelers diarrhea.

## 2014-11-16 NOTE — Telephone Encounter (Signed)
Rx sent to pharmacy via fax. Left a message for patient to call back.

## 2014-11-16 NOTE — Telephone Encounter (Signed)
Patient notified that rx has been sent. 

## 2015-05-15 DIAGNOSIS — M818 Other osteoporosis without current pathological fracture: Secondary | ICD-10-CM | POA: Diagnosis not present

## 2015-05-15 DIAGNOSIS — Z01419 Encounter for gynecological examination (general) (routine) without abnormal findings: Secondary | ICD-10-CM | POA: Diagnosis not present

## 2015-05-15 DIAGNOSIS — Z124 Encounter for screening for malignant neoplasm of cervix: Secondary | ICD-10-CM | POA: Diagnosis not present

## 2015-08-22 DIAGNOSIS — M8580 Other specified disorders of bone density and structure, unspecified site: Secondary | ICD-10-CM | POA: Diagnosis not present

## 2015-08-22 DIAGNOSIS — R7301 Impaired fasting glucose: Secondary | ICD-10-CM | POA: Diagnosis not present

## 2015-08-22 DIAGNOSIS — I10 Essential (primary) hypertension: Secondary | ICD-10-CM | POA: Diagnosis not present

## 2015-08-22 DIAGNOSIS — E785 Hyperlipidemia, unspecified: Secondary | ICD-10-CM | POA: Diagnosis not present

## 2015-08-22 DIAGNOSIS — Z0000 Encounter for general adult medical examination without abnormal findings: Secondary | ICD-10-CM | POA: Diagnosis not present

## 2015-08-22 DIAGNOSIS — M859 Disorder of bone density and structure, unspecified: Secondary | ICD-10-CM | POA: Diagnosis not present

## 2015-08-29 DIAGNOSIS — R7301 Impaired fasting glucose: Secondary | ICD-10-CM | POA: Diagnosis not present

## 2015-08-29 DIAGNOSIS — Z1389 Encounter for screening for other disorder: Secondary | ICD-10-CM | POA: Diagnosis not present

## 2015-08-29 DIAGNOSIS — M8580 Other specified disorders of bone density and structure, unspecified site: Secondary | ICD-10-CM | POA: Diagnosis not present

## 2015-08-29 DIAGNOSIS — T148 Other injury of unspecified body region: Secondary | ICD-10-CM | POA: Diagnosis not present

## 2015-08-29 DIAGNOSIS — Z23 Encounter for immunization: Secondary | ICD-10-CM | POA: Diagnosis not present

## 2015-08-29 DIAGNOSIS — I10 Essential (primary) hypertension: Secondary | ICD-10-CM | POA: Diagnosis not present

## 2015-08-29 DIAGNOSIS — E785 Hyperlipidemia, unspecified: Secondary | ICD-10-CM | POA: Diagnosis not present

## 2015-08-29 DIAGNOSIS — Z6828 Body mass index (BMI) 28.0-28.9, adult: Secondary | ICD-10-CM | POA: Diagnosis not present

## 2015-08-29 DIAGNOSIS — Z0000 Encounter for general adult medical examination without abnormal findings: Secondary | ICD-10-CM | POA: Diagnosis not present

## 2015-09-22 DIAGNOSIS — M810 Age-related osteoporosis without current pathological fracture: Secondary | ICD-10-CM | POA: Diagnosis not present

## 2015-09-22 DIAGNOSIS — M8589 Other specified disorders of bone density and structure, multiple sites: Secondary | ICD-10-CM | POA: Diagnosis not present

## 2015-09-22 DIAGNOSIS — Z1231 Encounter for screening mammogram for malignant neoplasm of breast: Secondary | ICD-10-CM | POA: Diagnosis not present

## 2015-09-22 DIAGNOSIS — Z803 Family history of malignant neoplasm of breast: Secondary | ICD-10-CM | POA: Diagnosis not present

## 2015-09-27 DIAGNOSIS — Z6827 Body mass index (BMI) 27.0-27.9, adult: Secondary | ICD-10-CM | POA: Diagnosis not present

## 2015-09-27 DIAGNOSIS — I10 Essential (primary) hypertension: Secondary | ICD-10-CM | POA: Diagnosis not present

## 2015-11-22 DIAGNOSIS — I10 Essential (primary) hypertension: Secondary | ICD-10-CM | POA: Diagnosis not present

## 2015-11-22 DIAGNOSIS — Z6828 Body mass index (BMI) 28.0-28.9, adult: Secondary | ICD-10-CM | POA: Diagnosis not present

## 2016-01-01 DIAGNOSIS — H35379 Puckering of macula, unspecified eye: Secondary | ICD-10-CM | POA: Diagnosis not present

## 2016-01-01 DIAGNOSIS — Z961 Presence of intraocular lens: Secondary | ICD-10-CM | POA: Diagnosis not present

## 2016-06-25 DIAGNOSIS — Z124 Encounter for screening for malignant neoplasm of cervix: Secondary | ICD-10-CM | POA: Diagnosis not present

## 2016-08-26 DIAGNOSIS — M859 Disorder of bone density and structure, unspecified: Secondary | ICD-10-CM | POA: Diagnosis not present

## 2016-08-26 DIAGNOSIS — E784 Other hyperlipidemia: Secondary | ICD-10-CM | POA: Diagnosis not present

## 2016-08-26 DIAGNOSIS — I10 Essential (primary) hypertension: Secondary | ICD-10-CM | POA: Diagnosis not present

## 2016-08-26 DIAGNOSIS — R7301 Impaired fasting glucose: Secondary | ICD-10-CM | POA: Diagnosis not present

## 2016-08-29 DIAGNOSIS — R7301 Impaired fasting glucose: Secondary | ICD-10-CM | POA: Diagnosis not present

## 2016-08-29 DIAGNOSIS — D649 Anemia, unspecified: Secondary | ICD-10-CM | POA: Diagnosis not present

## 2016-08-29 DIAGNOSIS — R012 Other cardiac sounds: Secondary | ICD-10-CM | POA: Diagnosis not present

## 2016-08-29 DIAGNOSIS — M859 Disorder of bone density and structure, unspecified: Secondary | ICD-10-CM | POA: Diagnosis not present

## 2016-08-29 DIAGNOSIS — R7989 Other specified abnormal findings of blood chemistry: Secondary | ICD-10-CM | POA: Diagnosis not present

## 2016-08-29 DIAGNOSIS — Z23 Encounter for immunization: Secondary | ICD-10-CM | POA: Diagnosis not present

## 2016-08-29 DIAGNOSIS — E784 Other hyperlipidemia: Secondary | ICD-10-CM | POA: Diagnosis not present

## 2016-08-29 DIAGNOSIS — Z0000 Encounter for general adult medical examination without abnormal findings: Secondary | ICD-10-CM | POA: Diagnosis not present

## 2016-08-29 DIAGNOSIS — I10 Essential (primary) hypertension: Secondary | ICD-10-CM | POA: Diagnosis not present

## 2016-08-29 DIAGNOSIS — Z6828 Body mass index (BMI) 28.0-28.9, adult: Secondary | ICD-10-CM | POA: Diagnosis not present

## 2016-08-29 DIAGNOSIS — Z1389 Encounter for screening for other disorder: Secondary | ICD-10-CM | POA: Diagnosis not present

## 2016-09-02 DIAGNOSIS — Z1212 Encounter for screening for malignant neoplasm of rectum: Secondary | ICD-10-CM | POA: Diagnosis not present

## 2016-09-03 ENCOUNTER — Other Ambulatory Visit: Payer: Self-pay | Admitting: Internal Medicine

## 2016-09-03 DIAGNOSIS — R011 Cardiac murmur, unspecified: Secondary | ICD-10-CM

## 2016-09-19 ENCOUNTER — Ambulatory Visit (HOSPITAL_COMMUNITY): Payer: Medicare Other | Attending: Cardiology

## 2016-09-19 ENCOUNTER — Other Ambulatory Visit: Payer: Self-pay

## 2016-09-19 DIAGNOSIS — I517 Cardiomegaly: Secondary | ICD-10-CM | POA: Insufficient documentation

## 2016-09-19 DIAGNOSIS — R011 Cardiac murmur, unspecified: Secondary | ICD-10-CM

## 2016-09-19 DIAGNOSIS — I119 Hypertensive heart disease without heart failure: Secondary | ICD-10-CM | POA: Insufficient documentation

## 2016-10-03 DIAGNOSIS — I10 Essential (primary) hypertension: Secondary | ICD-10-CM | POA: Diagnosis not present

## 2016-10-18 ENCOUNTER — Ambulatory Visit (INDEPENDENT_AMBULATORY_CARE_PROVIDER_SITE_OTHER): Payer: Medicare Other | Admitting: Internal Medicine

## 2016-10-18 DIAGNOSIS — Z7189 Other specified counseling: Secondary | ICD-10-CM | POA: Diagnosis not present

## 2016-10-18 DIAGNOSIS — Z7184 Encounter for health counseling related to travel: Secondary | ICD-10-CM

## 2016-10-18 DIAGNOSIS — Z23 Encounter for immunization: Secondary | ICD-10-CM | POA: Diagnosis not present

## 2016-10-18 DIAGNOSIS — Z9189 Other specified personal risk factors, not elsewhere classified: Secondary | ICD-10-CM | POA: Diagnosis not present

## 2016-10-18 DIAGNOSIS — Z789 Other specified health status: Secondary | ICD-10-CM

## 2016-10-18 MED ORDER — SCOPOLAMINE 1 MG/3DAYS TD PT72: 1.0000 | MEDICATED_PATCH | TRANSDERMAL | 0 refills | Status: DC

## 2016-10-18 MED ORDER — AZITHROMYCIN 500 MG PO TABS: 500.0000 mg | ORAL_TABLET | Freq: Every day | ORAL | 0 refills | Status: DC

## 2016-10-18 MED ORDER — ATOVAQUONE-PROGUANIL HCL 250-100 MG PO TABS: 1.0000 | ORAL_TABLET | Freq: Every day | ORAL | 0 refills | Status: DC

## 2016-10-18 MED ORDER — ZOLPIDEM TARTRATE 5 MG PO TABS: 5.0000 mg | ORAL_TABLET | Freq: Every evening | ORAL | 0 refills | Status: DC | PRN

## 2016-10-18 NOTE — Progress Notes (Signed)
Subjective:   [name] is a 75 y.o. female who presents to the Infectious Disease clinic for travel consultation. She and her husband are extensive travelers who have been to [place], [place], [place], [place] and [place] to name a few places. They will be going to [place] and [place] for a guided [employer] tour Planned departure date: march 4th         Planned return date: march 24th Countries of travel: [place] and [place] Areas in country: urban Accommodations: hotels Purpose of travel: vacation/personal enrichment Prior travel out of [place]: yes     Objective:   Medications: Lisinopril, vit d, statin  ALl: occ rash with penicillin in the past Assessment:   No contraindications to travel. none   Plan:   Pre travel vaccine = they will need typhoid inj vaccine booster to last next 2 years. uptodate on toher vaccines  Malaria proph = gave malarone for endemic period of march 6-24, plus 7 days  Jet lag= gave rx for ambien for which she takes regularly without difficulty  Motion sickness= she has used scopolamine in the past and recommend 1 patch for boat rides

## 2016-10-18 NOTE — Patient Instructions (Addendum)
[facility]                [address], [place]                   [place], [place] [address]                      Phone: [phone]                        Fax: [phone]   Planned departure date:  Early march Planned return date: march 24th Countries of travel: [place], [place]  Guidelines for the Prevention & Treatment of Traveler's Diarrhea  Prevention: "Boil it, Peel it, Lacinda Axon it, or Forget it"   the fewer chances -> lower risk: try to stick to food & water precautions as much as possible"   If it's "piping hot"; it is probably okay, if not, it may not be   Treatment   1) You should always take care to drink lots of fluids in order to avoid dehydration   2) You should bring medications with you in case you come down with a case of diarrhea   3) OTC = bring pepto-bismol - can take with initial abdominal symptoms;                    Imodium - can help slow down your intestinal tract, can help relief cramps                    and diarrhea, can take if no bloody diarrhea  Use azithromycin if needed for traveler's diarrhea  Guidelines for the Prevention of Malaria  Avoidance:  -fewer mosquito bites = lower risk. Mosquitos can bite at night as well as daytime  -cover up (long sleeve clothing), mosquito nets, screens  -Insect repellent for your skin ( DEET containing lotion > 20%): for clothes ( permethrin spray)   On march 6th start malarone for malaria prevention.   Immunizations received today: typhoid inection Future immunizations, if indicated, - if further travel in 2 years, you will need a booster typhoid vaccine  Prior to travel:  1) Be sure to pick up appropriate prescriptions, including medicine you take daily. Do not expect to be able to fill your prescriptions abroad.  2) Strongly consider obtaining traveler's insurance, including emergency evacuation insurance. Most plans in the [place] do not cover participants  abroad. (see below for resources)  3) Register at the appropriate [place] embassy or consulate with travel dates so they are aware of your presence in-country and for helpful advice during travel using the [employer] (STEP, GreenNylon.com.cy).  4) Leave contact information with a relative or friend.  5) Keep a Research officer, political party, credit cards in case they become lost or stolen  6) Inform your credit card company that you will be travelling abroad   During travel:  1) If you become ill and need medical advice, the [place] [employer]	[place] of the country you are traveling in general provides a list of [place] speaking doctors.  We are also available on MyChart for remote consultation if you register prior to travel. 2) Avoid motorcycles or scooters when at all possible. Traffic laws in many countries are lax and accidents occur frequently.  3) Do not take any unnecessary risks that you wouldn't do at home.   Resources:  -Country specific  information: BlindResource.ca or GreenNylon.com.cy  -Travel Supplies (DEET, mosquito nets): [employer], [employer] store, [employer], Hope insurance options: gatewayplans.com; http://clayton-rivera.info/; travelguard.com or Good [employer], gninsurance.com or info@gninsurance .com, (403) 483-8146.   Post Travel:  If you return from your trip ill, call your primary care doctor or our travel clinic @ [phone].   Enjoy your trip and know that with proper pre-travel preparation, most people have an enjoyable and uninterrupted trip!

## 2016-11-29 DIAGNOSIS — L821 Other seborrheic keratosis: Secondary | ICD-10-CM | POA: Diagnosis not present

## 2017-01-06 DIAGNOSIS — H35371 Puckering of macula, right eye: Secondary | ICD-10-CM | POA: Diagnosis not present

## 2017-01-06 DIAGNOSIS — H524 Presbyopia: Secondary | ICD-10-CM | POA: Diagnosis not present

## 2017-01-06 DIAGNOSIS — Z961 Presence of intraocular lens: Secondary | ICD-10-CM | POA: Diagnosis not present

## 2017-09-04 DIAGNOSIS — R7301 Impaired fasting glucose: Secondary | ICD-10-CM | POA: Diagnosis not present

## 2017-09-04 DIAGNOSIS — Z0000 Encounter for general adult medical examination without abnormal findings: Secondary | ICD-10-CM | POA: Diagnosis not present

## 2017-09-04 DIAGNOSIS — I10 Essential (primary) hypertension: Secondary | ICD-10-CM | POA: Diagnosis not present

## 2017-09-04 DIAGNOSIS — M859 Disorder of bone density and structure, unspecified: Secondary | ICD-10-CM | POA: Diagnosis not present

## 2017-09-04 DIAGNOSIS — Z23 Encounter for immunization: Secondary | ICD-10-CM | POA: Diagnosis not present

## 2017-09-04 DIAGNOSIS — N390 Urinary tract infection, site not specified: Secondary | ICD-10-CM | POA: Diagnosis not present

## 2017-09-04 DIAGNOSIS — E7849 Other hyperlipidemia: Secondary | ICD-10-CM | POA: Diagnosis not present

## 2017-09-11 DIAGNOSIS — E7849 Other hyperlipidemia: Secondary | ICD-10-CM | POA: Diagnosis not present

## 2017-09-11 DIAGNOSIS — R7301 Impaired fasting glucose: Secondary | ICD-10-CM | POA: Diagnosis not present

## 2017-09-11 DIAGNOSIS — R011 Cardiac murmur, unspecified: Secondary | ICD-10-CM | POA: Diagnosis not present

## 2017-09-11 DIAGNOSIS — Z6826 Body mass index (BMI) 26.0-26.9, adult: Secondary | ICD-10-CM | POA: Diagnosis not present

## 2017-09-11 DIAGNOSIS — Z0000 Encounter for general adult medical examination without abnormal findings: Secondary | ICD-10-CM | POA: Diagnosis not present

## 2017-09-11 DIAGNOSIS — I10 Essential (primary) hypertension: Secondary | ICD-10-CM | POA: Diagnosis not present

## 2017-09-11 DIAGNOSIS — Z1389 Encounter for screening for other disorder: Secondary | ICD-10-CM | POA: Diagnosis not present

## 2017-09-11 DIAGNOSIS — M859 Disorder of bone density and structure, unspecified: Secondary | ICD-10-CM | POA: Diagnosis not present

## 2017-09-15 DIAGNOSIS — Z1212 Encounter for screening for malignant neoplasm of rectum: Secondary | ICD-10-CM | POA: Diagnosis not present

## 2017-09-25 DIAGNOSIS — M8589 Other specified disorders of bone density and structure, multiple sites: Secondary | ICD-10-CM | POA: Diagnosis not present

## 2017-09-25 DIAGNOSIS — Z1231 Encounter for screening mammogram for malignant neoplasm of breast: Secondary | ICD-10-CM | POA: Diagnosis not present

## 2017-10-21 DIAGNOSIS — Z124 Encounter for screening for malignant neoplasm of cervix: Secondary | ICD-10-CM | POA: Diagnosis not present

## 2017-10-21 DIAGNOSIS — Z01419 Encounter for gynecological examination (general) (routine) without abnormal findings: Secondary | ICD-10-CM | POA: Diagnosis not present

## 2018-01-21 DIAGNOSIS — L821 Other seborrheic keratosis: Secondary | ICD-10-CM | POA: Diagnosis not present

## 2018-01-21 DIAGNOSIS — D1801 Hemangioma of skin and subcutaneous tissue: Secondary | ICD-10-CM | POA: Diagnosis not present

## 2018-01-21 DIAGNOSIS — L738 Other specified follicular disorders: Secondary | ICD-10-CM | POA: Diagnosis not present

## 2018-01-21 DIAGNOSIS — L853 Xerosis cutis: Secondary | ICD-10-CM | POA: Diagnosis not present

## 2018-02-10 DIAGNOSIS — M8580 Other specified disorders of bone density and structure, unspecified site: Secondary | ICD-10-CM | POA: Diagnosis not present

## 2018-03-06 DIAGNOSIS — Z961 Presence of intraocular lens: Secondary | ICD-10-CM | POA: Diagnosis not present

## 2018-03-06 DIAGNOSIS — H35371 Puckering of macula, right eye: Secondary | ICD-10-CM | POA: Diagnosis not present

## 2018-09-10 DIAGNOSIS — R7301 Impaired fasting glucose: Secondary | ICD-10-CM | POA: Diagnosis not present

## 2018-09-10 DIAGNOSIS — R82998 Other abnormal findings in urine: Secondary | ICD-10-CM | POA: Diagnosis not present

## 2018-09-10 DIAGNOSIS — Z23 Encounter for immunization: Secondary | ICD-10-CM | POA: Diagnosis not present

## 2018-09-10 DIAGNOSIS — I10 Essential (primary) hypertension: Secondary | ICD-10-CM | POA: Diagnosis not present

## 2018-09-10 DIAGNOSIS — E7849 Other hyperlipidemia: Secondary | ICD-10-CM | POA: Diagnosis not present

## 2018-09-10 DIAGNOSIS — M859 Disorder of bone density and structure, unspecified: Secondary | ICD-10-CM | POA: Diagnosis not present

## 2018-09-17 DIAGNOSIS — E7849 Other hyperlipidemia: Secondary | ICD-10-CM | POA: Diagnosis not present

## 2018-09-17 DIAGNOSIS — Z1389 Encounter for screening for other disorder: Secondary | ICD-10-CM | POA: Diagnosis not present

## 2018-09-17 DIAGNOSIS — Z6825 Body mass index (BMI) 25.0-25.9, adult: Secondary | ICD-10-CM | POA: Diagnosis not present

## 2018-09-17 DIAGNOSIS — R7301 Impaired fasting glucose: Secondary | ICD-10-CM | POA: Diagnosis not present

## 2018-09-17 DIAGNOSIS — Z0000 Encounter for general adult medical examination without abnormal findings: Secondary | ICD-10-CM | POA: Diagnosis not present

## 2018-09-17 DIAGNOSIS — M859 Disorder of bone density and structure, unspecified: Secondary | ICD-10-CM | POA: Diagnosis not present

## 2018-09-17 DIAGNOSIS — I10 Essential (primary) hypertension: Secondary | ICD-10-CM | POA: Diagnosis not present

## 2018-09-22 DIAGNOSIS — Z1212 Encounter for screening for malignant neoplasm of rectum: Secondary | ICD-10-CM | POA: Diagnosis not present

## 2018-10-08 DIAGNOSIS — Z1231 Encounter for screening mammogram for malignant neoplasm of breast: Secondary | ICD-10-CM | POA: Diagnosis not present

## 2018-10-20 DIAGNOSIS — R921 Mammographic calcification found on diagnostic imaging of breast: Secondary | ICD-10-CM | POA: Diagnosis not present

## 2019-01-14 DIAGNOSIS — I498 Other specified cardiac arrhythmias: Secondary | ICD-10-CM | POA: Diagnosis not present

## 2019-01-14 DIAGNOSIS — Z6824 Body mass index (BMI) 24.0-24.9, adult: Secondary | ICD-10-CM | POA: Diagnosis not present

## 2019-01-14 DIAGNOSIS — R600 Localized edema: Secondary | ICD-10-CM | POA: Diagnosis not present

## 2019-01-14 DIAGNOSIS — I10 Essential (primary) hypertension: Secondary | ICD-10-CM | POA: Diagnosis not present

## 2019-01-14 DIAGNOSIS — M2550 Pain in unspecified joint: Secondary | ICD-10-CM | POA: Diagnosis not present

## 2019-03-29 DIAGNOSIS — R600 Localized edema: Secondary | ICD-10-CM | POA: Diagnosis not present

## 2019-03-29 DIAGNOSIS — I10 Essential (primary) hypertension: Secondary | ICD-10-CM | POA: Diagnosis not present

## 2019-03-29 DIAGNOSIS — T698XXA Other specified effects of reduced temperature, initial encounter: Secondary | ICD-10-CM | POA: Diagnosis not present

## 2019-03-30 DIAGNOSIS — I10 Essential (primary) hypertension: Secondary | ICD-10-CM | POA: Diagnosis not present

## 2019-05-31 DIAGNOSIS — M2550 Pain in unspecified joint: Secondary | ICD-10-CM | POA: Diagnosis not present

## 2019-05-31 DIAGNOSIS — H6691 Otitis media, unspecified, right ear: Secondary | ICD-10-CM | POA: Diagnosis not present

## 2019-07-08 DIAGNOSIS — Z23 Encounter for immunization: Secondary | ICD-10-CM | POA: Diagnosis not present

## 2019-09-21 DIAGNOSIS — M859 Disorder of bone density and structure, unspecified: Secondary | ICD-10-CM | POA: Diagnosis not present

## 2019-09-21 DIAGNOSIS — R7301 Impaired fasting glucose: Secondary | ICD-10-CM | POA: Diagnosis not present

## 2019-09-21 DIAGNOSIS — E7849 Other hyperlipidemia: Secondary | ICD-10-CM | POA: Diagnosis not present

## 2019-09-23 DIAGNOSIS — Z1212 Encounter for screening for malignant neoplasm of rectum: Secondary | ICD-10-CM | POA: Diagnosis not present

## 2019-09-24 DIAGNOSIS — D649 Anemia, unspecified: Secondary | ICD-10-CM | POA: Diagnosis not present

## 2019-09-27 DIAGNOSIS — Z1212 Encounter for screening for malignant neoplasm of rectum: Secondary | ICD-10-CM | POA: Diagnosis not present

## 2019-10-13 ENCOUNTER — Encounter: Payer: Self-pay | Admitting: Physician Assistant

## 2019-11-04 ENCOUNTER — Encounter: Payer: Self-pay | Admitting: Gastroenterology

## 2019-11-04 ENCOUNTER — Ambulatory Visit (INDEPENDENT_AMBULATORY_CARE_PROVIDER_SITE_OTHER): Payer: Medicare Other | Admitting: Physician Assistant

## 2019-11-04 ENCOUNTER — Encounter: Payer: Self-pay | Admitting: Physician Assistant

## 2019-11-04 ENCOUNTER — Other Ambulatory Visit: Payer: Self-pay

## 2019-11-04 VITALS — BP 118/60 | HR 88 | Temp 98.1°F | Ht 63.0 in | Wt 124.4 lb

## 2019-11-04 DIAGNOSIS — E785 Hyperlipidemia, unspecified: Secondary | ICD-10-CM | POA: Insufficient documentation

## 2019-11-04 DIAGNOSIS — D509 Iron deficiency anemia, unspecified: Secondary | ICD-10-CM

## 2019-11-04 DIAGNOSIS — R195 Other fecal abnormalities: Secondary | ICD-10-CM | POA: Diagnosis not present

## 2019-11-04 HISTORY — DX: Iron deficiency anemia, unspecified: D50.9

## 2019-11-04 NOTE — Patient Instructions (Signed)
If you are age 78 or older, your body mass index should be between 23-30. Your Body mass index is 22.04 kg/m. If this is out of the aforementioned range listed, please consider follow up with your Primary Care Provider.  If you are age 30 or younger, your body mass index should be between 19-25. Your Body mass index is 22.04 kg/m. If this is out of the aformentioned range listed, please consider follow up with your Primary Care Provider.   Please start taking Ferrous Sulfate 325 mg one tablet by mouth twice daily. Please take this medication with food. It is available over the counter.  It has been recommended to you by your physician that you have a(n) colonoscopy and endoscopy completed. Per your request, we did not schedule the procedure(s) today. Please contact our office at [phone] should you decide to have the procedure completed.   Thank you for choosing me and [facility]

## 2019-11-04 NOTE — Progress Notes (Signed)
Subjective:    Patient ID: [name], female    DOB: Nov 14, 1941, 78 y.o.   MRN: [id number]  HPI [name] is a pleasant 78 year old white female, known remotely to Dr. Delfin Edis and last seen in 2015 who is referred today by Dr. Brigitte Pulse for heme positive stool and new finding of iron deficiency anemia. Patient last had colonoscopy in 2015 with Dr. Olevia Perches.  She had one polyp removed at that time which was hyperplastic. She does have a maternal aunt with history of colon cancer. Patient has no prior history of iron deficiency anemia. Recent Hemoccult was positive.  Labs from November 2020 showed hemoglobin 10.7/hematocrit of 35.5/MCV of 84, ferritin 207, serum iron 22, iron sat 7 and TIBC of 296.  She has not been started on oral iron. Patient has no complaints of abdominal pain or discomfort, no changes in bowel habits, bowel movements have been normal.  She denies any heartburn or indigestion, no dysphagia.  No nausea.  She says her appetite has been fine and weight has been stable.  She says she and her husband have been eating very differently over the past several months because they are primarily eating at home.  No regular NSAID use. Patient relates she is a little upset today as her husband was diagnosed with prostate cancer yesterday and will be undergoing radiation treatment.  Review of Systems Pertinent positive and negative review of systems were noted in the above HPI section.  All other review of systems was otherwise negative.  Outpatient Encounter Medications as of 11/04/2019  Medication Sig  . alendronate (FOSAMAX) 70 MG tablet Take 70 mg by mouth once a week. Take with a full glass of water on an empty stomach.  Marland Kitchen atorvastatin (LIPITOR) 10 MG tablet Take 10 mg by mouth daily.  Marland Kitchen CALCIUM CITRATE PO Take 1 tablet by mouth daily.  . Cholecalciferol (VITAMIN D3) 50 MCG (2000 UT) TABS Take 1 tablet by mouth daily.  . Coenzyme Q10 (COQ10) 100 MG CAPS Take 1 capsule by mouth daily.   . Cyanocobalamin (B-12) 2500 MCG TABS Take 1 tablet by mouth daily.  . diclofenac Sodium (VOLTAREN) 1 % GEL Apply 1 application topically as needed.  . docusate sodium (COLACE) 50 MG capsule Take 50 mg by mouth daily.  Marland Kitchen lisinopril-hydrochlorothiazide (ZESTORETIC) 20-25 MG tablet Take 1 tablet by mouth daily.  . Magnesium 250 MG TABS Take 1 tablet by mouth daily.  . Multiple Vitamins-Minerals (CENTRUM SILVER PO) Take 1 tablet by mouth daily.  . Multiple Vitamins-Minerals (ICAPS AREDS 2 PO) Take 1 capsule by mouth daily.  . Potassium Gluconate 550 MG TABS Take 1 tablet by mouth daily.  . Turmeric (QC TUMERIC COMPLEX) 500 MG CAPS Take 1 capsule by mouth daily.  Marland Kitchen zolpidem (AMBIEN) 10 MG tablet Take 10 mg by mouth at bedtime as needed for sleep.  . [DISCONTINUED] aspirin 81 MG tablet Take 81 mg by mouth daily.  . [DISCONTINUED] atovaquone-proguanil (MALARONE) 250-100 MG TABS tablet Take 1 tablet by mouth daily.  . [DISCONTINUED] azithromycin (ZITHROMAX) 500 MG tablet Take 1 tablet (500 mg total) by mouth daily. If you have 3+ loose stools in 24hr. Can stop taking if diarrhea resolves  . [DISCONTINUED] diphenoxylate-atropine (LOMOTIL) 2.5-0.025 MG per tablet Take one or two tablets up to TID prn diarrhea  . [DISCONTINUED] hydrochlorothiazide (HYDRODIURIL) 25 MG tablet Take 25 mg by mouth daily.  . [DISCONTINUED] Risedronate Sodium (ATELVIA PO) Take by mouth. For osteoporosis  . [DISCONTINUED] scopolamine (TRANSDERM-SCOP,  1.5 MG,) 1 MG/3DAYS Place 1 patch (1.5 mg total) onto the skin every 3 (three) days.  . [DISCONTINUED] zolpidem (AMBIEN) 5 MG tablet Take 1 tablet (5 mg total) by mouth at bedtime as needed for sleep.   No facility-administered encounter medications on file as of 11/04/2019.   Allergies  Allergen Reactions  . Penicillins Rash   Patient Active Problem List   Diagnosis Date Noted  . Iron deficiency anemia 11/04/2019  . Hyperlipidemia 11/04/2019   Social History    Socioeconomic History  . Marital status: Married    Spouse name: Not on file  . Number of children: Not on file  . Years of education: Not on file  . Highest education level: Not on file  Occupational History  . Not on file  Tobacco Use  . Smoking status: Former Research scientist (life sciences)  . Smokeless tobacco: Never Used  Substance and Sexual Activity  . Alcohol use: Yes    Alcohol/week: 1.0 standard drinks    Types: 1 Glasses of wine per week    Comment: or burbon  . Drug use: No  . Sexual activity: Not on file  Other Topics Concern  . Not on file  Social History Narrative  . Not on file   Social Determinants of Health   Financial Resource Strain:   . Difficulty of Paying Living Expenses: Not on file  Food Insecurity:   . Worried About Charity fundraiser in the Last Year: Not on file  . Ran Out of Food in the Last Year: Not on file  Transportation Needs:   . Lack of Transportation (Medical): Not on file  . Lack of Transportation (Non-Medical): Not on file  Physical Activity:   . Days of Exercise per Week: Not on file  . Minutes of Exercise per Session: Not on file  Stress:   . Feeling of Stress : Not on file  Social Connections:   . Frequency of Communication with Friends and Family: Not on file  . Frequency of Social Gatherings with Friends and Family: Not on file  . Attends Religious Services: Not on file  . Active Member of Clubs or Organizations: Not on file  . Attends Archivist Meetings: Not on file  . Marital Status: Not on file  Intimate Partner Violence:   . Fear of Current or Ex-Partner: Not on file  . Emotionally Abused: Not on file  . Physically Abused: Not on file  . Sexually Abused: Not on file    Ms. Bradeen's family history includes Breast cancer in her maternal aunt; Colon cancer in her maternal aunt; Diabetes in her maternal aunt and maternal grandfather.      Objective:    Vitals:   11/04/19 1042  BP: 118/60  Pulse: 88  Temp: 98.1 F (36.7  C)    Physical Exam Well-developed well-nourished elderly white female in no acute distress.  Pleasant  Weight, 124 BMI 22.0  HEENT; nontraumatic normocephalic, EOMI, PER R LA, sclera anicteric. Oropharynx; not examined/mask/Covid Neck; supple, no JVD Cardiovascular; regular rate and rhythm with S1-S2, no murmur rub or gallop Pulmonary; Clear bilaterally Abdomen; soft, nontender, nondistended, no palpable mass or hepatosplenomegaly, bowel sounds are active Rectal; not done today recently documented heme positive Skin; benign exam, no jaundice rash or appreciable lesions Extremities; no clubbing cyanosis or edema skin warm and dry Neuro/Psych; alert and oriented x4, grossly nonfocal mood and affect appropriate       Assessment & Plan:   #57 78 year old female with  new iron deficiency anemia and Hemoccult positive stool, asymptomatic. Rule out occult colon neoplasm, upper gut lesion, AVMs.  #2 prior history of hyperplastic colon polyp. 3.  Hyperlipidemia 4.  Hypertension  Plan; start ferrous sulfate 325 mg p.o. twice daily. Patient relates she has labs scheduled through Dr. Raul Del office tomorrow.  Hemoglobin will need to be followed and repeat iron studies in 3 months. Patient will be scheduled for Colonoscopy and Upper endoscopy with Dr. Havery Moros.  Both procedures were discussed in detail with the patient including indications risks and benefits and she is agreeable to proceed. We briefly discussed potential capsule endoscopy if endoscopic evaluation is unrevealing.  [name] Genia Harold PA-C 11/04/2019   Cc: Marton Redwood, MD

## 2019-11-05 DIAGNOSIS — D509 Iron deficiency anemia, unspecified: Secondary | ICD-10-CM | POA: Diagnosis not present

## 2019-11-05 NOTE — Progress Notes (Signed)
Agree with assessment and plan as outlined.  

## 2019-11-28 ENCOUNTER — Ambulatory Visit: Payer: Medicare Other | Attending: Internal Medicine

## 2019-11-28 ENCOUNTER — Other Ambulatory Visit: Payer: Self-pay

## 2019-11-28 DIAGNOSIS — Z23 Encounter for immunization: Secondary | ICD-10-CM

## 2019-11-28 NOTE — Progress Notes (Signed)
   Covid-19 Vaccination Clinic  Name:  [name]    MRN: [id number] DOB: [birth year]  11/28/2019  [name] was observed post Covid-19 immunization for 15 minutes without incidence. She was provided with Vaccine Information Sheet and instruction to access the V-Safe system.   [name] was instructed to call 911 with any severe reactions post vaccine: Marland Kitchen Difficulty breathing  . Swelling of your face and throat  . A fast heartbeat  . A bad rash all over your body  . Dizziness and weakness    Immunizations Administered    Name Date Dose VIS Date Route   Pfizer COVID-19 Vaccine 11/28/2019  1:04 PM 0.3 mL 10/29/2019 Intramuscular   Manufacturer: [place]   Lot: [id number]   [place]: [id number]

## 2019-11-30 ENCOUNTER — Ambulatory Visit (AMBULATORY_SURGERY_CENTER): Payer: Self-pay | Admitting: *Deleted

## 2019-11-30 ENCOUNTER — Other Ambulatory Visit: Payer: Self-pay

## 2019-11-30 VITALS — Temp 97.3°F | Ht 62.0 in | Wt 127.6 lb

## 2019-11-30 DIAGNOSIS — Z01818 Encounter for other preprocedural examination: Secondary | ICD-10-CM

## 2019-11-30 DIAGNOSIS — R195 Other fecal abnormalities: Secondary | ICD-10-CM

## 2019-11-30 DIAGNOSIS — D509 Iron deficiency anemia, unspecified: Secondary | ICD-10-CM

## 2019-11-30 MED ORDER — SUPREP BOWEL PREP KIT 17.5-3.13-1.6 GM/177ML PO SOLN: ORAL | 0 refills | Status: DC

## 2019-11-30 NOTE — Progress Notes (Signed)
Pt received first dose of the covid vaccine 11-28-19  Pt is aware that care partner will wait in the car during procedure; if they feel like they will be too hot or cold to wait in the car; they may wait in the 4 th floor lobby. Patient is aware to bring only one care partner. We want them to wear a mask (we do not have any that we can provide them), practice social distancing, and we will check their temperatures when they get here.  I did remind the patient that their care partner needs to stay in the parking lot the entire time and have a cell phone available, we will call them when the pt is ready for discharge. Patient will wear mask into building.  covid test 12-16-19 at 1:00 pm Pt instructed if they develop COVID symptoms, they should not go to have their test done; they should contact their PCP.  Also, they are told to let us know if they are sick and testing elsewhere.   No egg or soy allergy  No home oxygen use or problems with anesthesia  No medications for weight loss taken

## 2019-12-03 DIAGNOSIS — E7849 Other hyperlipidemia: Secondary | ICD-10-CM | POA: Diagnosis not present

## 2019-12-14 ENCOUNTER — Encounter: Payer: Medicare Other | Admitting: Gastroenterology

## 2019-12-16 ENCOUNTER — Ambulatory Visit (INDEPENDENT_AMBULATORY_CARE_PROVIDER_SITE_OTHER): Payer: Medicare Other

## 2019-12-16 ENCOUNTER — Other Ambulatory Visit: Payer: Self-pay | Admitting: Gastroenterology

## 2019-12-16 DIAGNOSIS — Z1159 Encounter for screening for other viral diseases: Secondary | ICD-10-CM

## 2019-12-17 LAB — SARS CORONAVIRUS 2 (TAT 6-24 HRS): SARS Coronavirus 2: NEGATIVE

## 2019-12-19 ENCOUNTER — Ambulatory Visit: Payer: Medicare Other | Attending: Internal Medicine

## 2019-12-19 DIAGNOSIS — Z23 Encounter for immunization: Secondary | ICD-10-CM

## 2019-12-19 NOTE — Progress Notes (Signed)
   Covid-19 Vaccination Clinic  Name:  [name]    MRN: [id number] DOB: Jan 22, 1941  12/19/2019  [name] was observed post Covid-19 immunization for 15 minutes without incidence. She was provided with Vaccine Information Sheet and instruction to access the V-Safe system.   [name] was instructed to call 911 with any severe reactions post vaccine: Marland Kitchen Difficulty breathing  . Swelling of your face and throat  . A fast heartbeat  . A bad rash all over your body  . Dizziness and weakness    Immunizations Administered    Name Date Dose VIS Date Route   Pfizer COVID-19 Vaccine 12/19/2019 12:25 PM 0.3 mL 10/29/2019 Intramuscular   Manufacturer: [place]   Lot: [id number]   [place]: [id number]

## 2019-12-21 ENCOUNTER — Encounter: Payer: Self-pay | Admitting: Gastroenterology

## 2019-12-21 ENCOUNTER — Ambulatory Visit (AMBULATORY_SURGERY_CENTER): Payer: Medicare Other | Admitting: Gastroenterology

## 2019-12-21 ENCOUNTER — Other Ambulatory Visit: Payer: Self-pay

## 2019-12-21 VITALS — BP 109/47 | HR 73 | Temp 96.9°F | Resp 13 | Ht 62.0 in | Wt 127.6 lb

## 2019-12-21 DIAGNOSIS — K621 Rectal polyp: Secondary | ICD-10-CM | POA: Diagnosis not present

## 2019-12-21 DIAGNOSIS — D123 Benign neoplasm of transverse colon: Secondary | ICD-10-CM | POA: Diagnosis not present

## 2019-12-21 DIAGNOSIS — R195 Other fecal abnormalities: Secondary | ICD-10-CM | POA: Diagnosis not present

## 2019-12-21 DIAGNOSIS — K2951 Unspecified chronic gastritis with bleeding: Secondary | ICD-10-CM | POA: Diagnosis not present

## 2019-12-21 DIAGNOSIS — D509 Iron deficiency anemia, unspecified: Secondary | ICD-10-CM | POA: Diagnosis not present

## 2019-12-21 DIAGNOSIS — K449 Diaphragmatic hernia without obstruction or gangrene: Secondary | ICD-10-CM | POA: Diagnosis not present

## 2019-12-21 DIAGNOSIS — K3189 Other diseases of stomach and duodenum: Secondary | ICD-10-CM | POA: Diagnosis not present

## 2019-12-21 DIAGNOSIS — D120 Benign neoplasm of cecum: Secondary | ICD-10-CM

## 2019-12-21 DIAGNOSIS — D128 Benign neoplasm of rectum: Secondary | ICD-10-CM

## 2019-12-21 DIAGNOSIS — K5730 Diverticulosis of large intestine without perforation or abscess without bleeding: Secondary | ICD-10-CM | POA: Diagnosis not present

## 2019-12-21 DIAGNOSIS — K222 Esophageal obstruction: Secondary | ICD-10-CM

## 2019-12-21 DIAGNOSIS — D129 Benign neoplasm of anus and anal canal: Secondary | ICD-10-CM

## 2019-12-21 DIAGNOSIS — D124 Benign neoplasm of descending colon: Secondary | ICD-10-CM | POA: Diagnosis not present

## 2019-12-21 MED ORDER — SODIUM CHLORIDE 0.9 % IV SOLN: 500.0000 mL | Freq: Once | INTRAVENOUS | Status: DC

## 2019-12-21 NOTE — Progress Notes (Signed)
A/ox3, pleased with MAC, report to RN 

## 2019-12-21 NOTE — Progress Notes (Signed)
No problems noted in the recovery room. maw 

## 2019-12-21 NOTE — Progress Notes (Signed)
Called to room to assist during endoscopic procedure.  Patient ID and intended procedure confirmed with present staff. Received instructions for my participation in the procedure from the performing physician.  

## 2019-12-21 NOTE — Op Note (Signed)
[place] Patient Name: [name] Procedure Date: 12/21/2019 9:11 AM MRN: [id number] Endoscopist: Remo Lipps P. Havery Moros , MD Age: 79 Referring MD:  Date of Birth: [birth year] Gender: Female Account #: [id number] Procedure:                Upper GI endoscopy Indications:              Iron deficiency anemia, Heme positive stool Medicines:                Monitored Anesthesia Care Procedure:                Pre-Anesthesia Assessment:                           - Prior to the procedure, a History and Physical                            was performed, and patient medications and                            allergies were reviewed. The patient's tolerance of                            previous anesthesia was also reviewed. The risks                            and benefits of the procedure and the sedation                            options and risks were discussed with the patient.                            All questions were answered, and informed consent                            was obtained. Prior Anticoagulants: The patient has                            taken no previous anticoagulant or antiplatelet                            agents. ASA Grade Assessment: II - A patient with                            mild systemic disease. After reviewing the risks                            and benefits, the patient was deemed in                            satisfactory condition to undergo the procedure.                           After obtaining informed consent, the endoscope was  passed under direct vision. Throughout the                            procedure, the patient's blood pressure, pulse, and                            oxygen saturations were monitored continuously. The                            Endoscope was introduced through the mouth, and                            advanced to the second part of duodenum. The upper                            GI  endoscopy was accomplished without difficulty.                            The patient tolerated the procedure well. Scope In: Scope Out: Findings:                 Esophagogastric landmarks were identified: the                            Z-line was found at 30 cm, the gastroesophageal                            junction was found at 30 cm and the upper extent of                            the gastric folds was found at 34 cm from the                            incisors.                           A 4 cm hiatal hernia was present.                           Slightly nodular mucosa was noted just inferior to                            the gastroesophageal junction in the hernia sac, I                            suspect inflammatory / reactive to reflux / hernia.                            Biopsies were taken with a cold forceps for                            histology.  The exam of the esophagus was otherwise normal.                           A single 8 mm subepithelial nodule was found in the                            proximal gastric fundus / cardia. Initially seen                            easily on retroflexion during the start of the                            exam, upon with drawing the scope with the hernia                            sac insufflated, it appeared to be at the distal                            end of the hernia sac. Hard to palpation. Bite on                            bite biopsies were taken with a cold forceps for                            histology.                           The exam of the stomach was otherwise normal.                           Biopsies were taken with a cold forceps in the                            gastric body, at the incisura and in the gastric                            antrum for Helicobacter pylori testing.                           The duodenal bulb and second portion of the                            duodenum  were normal. Complications:            No immediate complications. Estimated blood loss:                            Minimal. Estimated Blood Loss:     Estimated blood loss was minimal. Impression:               - Esophagogastric landmarks identified.                           - 4 cm hiatal hernia.                           -  Suspect inflammatory changes noted inferior to                            the GEJ due to the hernia. Biopsied.                           - A single benign appearing subepithelial nodule                            found in the stomach/ cardia. Biopsied.                           - Normal stomach otherwise - biopsies taken to rule                            out H pylori                           - Normal duodenal bulb and second portion of the                            duodenum.                           Possible the hernia could contribute to anemia but                            no obvious cameron lesions noted, some mild                            inflammatory changes associated however. Biopsies                            taken. Recommendation:           - Patient has a contact number available for                            emergencies. The signs and symptoms of potential                            delayed complications were discussed with the                            patient. Return to normal activities tomorrow.                            Written discharge instructions were provided to the                            patient.                           - Resume previous diet.                           -  Continue present medications.                           - Await pathology results. Continue oral iron,                            trend Hgb / iron studies, if anemia persists and no                            clear cause on biopsies, consideration for capsule                            endoscopy. Remo Lipps P. Havery Moros, MD 12/21/2019 10:20:54 AM This report has  been signed electronically.

## 2019-12-21 NOTE — Progress Notes (Signed)
Pt's states no medical or surgical changes since previsit or office visit. 

## 2019-12-21 NOTE — Progress Notes (Signed)
Temperature taken by J.B., VS taken by D.T. 

## 2019-12-21 NOTE — Op Note (Signed)
[place] Patient Name: [name] Procedure Date: 12/21/2019 9:10 AM MRN: [id number] Endoscopist: Remo Lipps P. Havery Moros , MD Age: 79 Referring MD:  Date of Birth: 1941/11/16 Gender: Female Account #: [id number] Procedure:                Colonoscopy Indications:              Heme positive stool, Iron deficiency anemia Medicines:                Monitored Anesthesia Care Procedure:                Pre-Anesthesia Assessment:                           - Prior to the procedure, a History and Physical                            was performed, and patient medications and                            allergies were reviewed. The patient's tolerance of                            previous anesthesia was also reviewed. The risks                            and benefits of the procedure and the sedation                            options and risks were discussed with the patient.                            All questions were answered, and informed consent                            was obtained. Prior Anticoagulants: The patient has                            taken no previous anticoagulant or antiplatelet                            agents. ASA Grade Assessment: II - A patient with                            mild systemic disease. After reviewing the risks                            and benefits, the patient was deemed in                            satisfactory condition to undergo the procedure.                           After obtaining informed consent, the colonoscope  was passed under direct vision. Throughout the                            procedure, the patient's blood pressure, pulse, and                            oxygen saturations were monitored continuously. The                            Colonoscope was introduced through the anus and                            advanced to the the terminal ileum, with                            identification of the  appendiceal orifice and IC                            valve. The colonoscopy was performed without                            difficulty. The patient tolerated the procedure                            well. The quality of the bowel preparation was                            adequate. The terminal ileum, ileocecal valve,                            appendiceal orifice, and rectum were photographed. Scope In: 9:36:34 AM Scope Out: 10:05:58 AM Scope Withdrawal Time: 0 hours 23 minutes 25 seconds  Total Procedure Duration: 0 hours 29 minutes 24 seconds  Findings:                 The perianal and digital rectal examinations were                            normal.                           The terminal ileum appeared normal.                           The colon was tortuous.                           Two sessile polyps were found in the cecum. The                            polyps were 3 and 10-12 mm in size. These polyps                            were removed with a cold snare. Resection and  retrieval were complete.                           A 5 mm polyp was found in the transverse colon. The                            polyp was sessile. The polyp was removed with a                            cold snare. Resection and retrieval were complete.                           A 3 mm polyp was found in the descending colon. The                            polyp was sessile. The polyp was removed with a                            cold snare. Resection and retrieval were complete.                           Multiple small-mouthed diverticula were found in                            the sigmoid colon and transverse colon.                           Three sessile polyps were found in the rectum. The                            polyps were 3 to 4 mm in size. These polyps were                            removed with a cold snare. Resection and retrieval                            were  complete.                           The exam was otherwise without abnormality. Complications:            No immediate complications. Estimated blood loss:                            Minimal. Estimated Blood Loss:     Estimated blood loss was minimal. Impression:               - The examined portion of the ileum was normal.                           - Tortuous colon.                           - Two 3 and 10-12 mm polyps in the cecum, removed  with a cold snare. Resected and retrieved.                           - One 5 mm polyp in the transverse colon, removed                            with a cold snare. Resected and retrieved.                           - One 3 mm polyp in the descending colon, removed                            with a cold snare. Resected and retrieved.                           - Diverticulosis in the sigmoid colon.                           - Three 3 to 4 mm polyps in the rectum, removed                            with a cold snare. Resected and retrieved.                           - The examination was otherwise normal.                           No obvert cause of iron deficiency noted on                            colonoscopy. Recommendation:           - Patient has a contact number available for                            emergencies. The signs and symptoms of potential                            delayed complications were discussed with the                            patient. Return to normal activities tomorrow.                            Written discharge instructions were provided to the                            patient.                           - Resume previous diet.                           - Continue present medications.                           -  Await pathology results from colonoscopy and EGD.                            Continue iron supplementation, monitor Hgb and iron                            studies. If iron  deficiency persists despite iron                            supplementation, consideration for capsule                            endoscopy. Remo Lipps P. Raesha Coonrod, MD 12/21/2019 10:12:37 AM This report has been signed electronically.

## 2019-12-21 NOTE — Patient Instructions (Addendum)
YOU HAD AN ENDOSCOPIC PROCEDURE TODAY AT THE [facility]:   Refer to the procedure report that was given to you for any specific questions about what was found during the examination.  If the procedure report does not answer your questions, please call your gastroenterologist to clarify.  If you requested that your care partner not be given the details of your procedure findings, then the procedure report has been included in a sealed envelope for you to review at your convenience later.  YOU SHOULD EXPECT: Some feelings of bloating in the abdomen. Passage of more gas than usual.  Walking can help get rid of the air that was put into your GI tract during the procedure and reduce the bloating. If you had a lower endoscopy (such as a colonoscopy or flexible sigmoidoscopy) you may notice spotting of blood in your stool or on the toilet paper. If you underwent a bowel prep for your procedure, you may not have a normal bowel movement for a few days.  Please Note:  You might notice some irritation and congestion in your nose or some drainage.  This is from the oxygen used during your procedure.  There is no need for concern and it should clear up in a day or so.  SYMPTOMS TO REPORT IMMEDIATELY:   Following lower endoscopy (colonoscopy or flexible sigmoidoscopy):  Excessive amounts of blood in the stool  Significant tenderness or worsening of abdominal pains  Swelling of the abdomen that is new, acute  Fever of 100F or higher   Following upper endoscopy (EGD)  Vomiting of blood or coffee ground material  New chest pain or pain under the shoulder blades  Painful or persistently difficult swallowing  New shortness of breath  Fever of 100F or higher  Black, tarry-looking stools  For urgent or emergent issues, a gastroenterologist can be reached at any hour by calling [phone].   DIET:  We do recommend a small meal at first, but then you may proceed to your regular diet.  Drink  plenty of fluids but you should avoid alcoholic beverages for 24 hours.  ACTIVITY:  You should plan to take it easy for the rest of today and you should NOT DRIVE or use heavy machinery until tomorrow (because of the sedation medicines used during the test).    FOLLOW UP: Our staff will call the number listed on your records 48-72 hours following your procedure to check on you and address any questions or concerns that you may have regarding the information given to you following your procedure. If we do not reach you, we will leave a message.  We will attempt to reach you two times.  During this call, we will ask if you have developed any symptoms of COVID 19. If you develop any symptoms (ie: fever, flu-like symptoms, shortness of breath, cough etc.) before then, please call [phone].  If you test positive for Covid 19 in the 2 weeks post procedure, please call and report this information to us.    If any biopsies were taken you will be contacted by phone or by letter within the next 1-3 weeks.  Please call us at [phone] if you have not heard about the biopsies in 3 weeks.    SIGNATURES/CONFIDENTIALITY: You and/or your care partner have signed paperwork which will be entered into your electronic medical record.  These signatures attest to the fact that that the information above on your After Visit Summary has been reviewed and is   understood.  Full responsibility of the confidentiality of this discharge information lies with you and/or your care-partner.    Handouts were given to you on a hiatal hernia, GERD, polyps, and diverticulosis. Continue IRON supplementation and monitor hemeglobin and iron studies.  If iron deficiency persists despite iron supplementation, consider for capsule endoscopy. You may resume your current medications today. Await biopsy results. Please call if any questions or concerns.

## 2019-12-23 ENCOUNTER — Telehealth: Payer: Self-pay | Admitting: *Deleted

## 2019-12-23 NOTE — Telephone Encounter (Addendum)
  Follow up Call-  Call back number 12/21/2019  Post procedure Call Back phone  # [phone]  Permission to leave phone message Yes  Some recent data might be hidden     Patient questions:  Do you have a fever, pain , or abdominal swelling? Patient states throat pain of #5. States "sores in her mouth"  Patient did say that her pain now is #4. Patient did not call the doctor on call. Would not answer questions about food. Pain Score  *  Have you tolerated food without any problems? Unkown  Have you been able to return to your normal activities? Patient's husband is undergoing radiation treatments.  Do you have any questions about your discharge instructions: Diet   no Medications  no Follow up visit  No.  Do you have questions or concerns about your Care? Yes.    Actions: * If pain score is 4 or above: Physician/ provider Notified : Carlota Raspberry. Armbruster, MD. When her gets here.  I spoke to Dr. Havery Moros and he stated to tell the patient that the pain can linger for a few days.  He stated to gargle with salt water and/or use chloraseptic spray. I suggested a soft diet until pain gets better.  I told her to call us if the pain gets worse.  She agreed.

## 2019-12-30 ENCOUNTER — Other Ambulatory Visit: Payer: Self-pay

## 2019-12-30 ENCOUNTER — Telehealth: Payer: Self-pay

## 2019-12-30 DIAGNOSIS — D509 Iron deficiency anemia, unspecified: Secondary | ICD-10-CM

## 2019-12-30 NOTE — Telephone Encounter (Signed)
Pt returned your call.  

## 2019-12-30 NOTE — Telephone Encounter (Signed)
Left message to please call back. °

## 2019-12-31 NOTE — Telephone Encounter (Signed)
See result note.  

## 2020-01-13 ENCOUNTER — Other Ambulatory Visit (INDEPENDENT_AMBULATORY_CARE_PROVIDER_SITE_OTHER): Payer: Medicare Other

## 2020-01-13 DIAGNOSIS — D509 Iron deficiency anemia, unspecified: Secondary | ICD-10-CM

## 2020-01-13 LAB — CBC WITH DIFFERENTIAL/PLATELET
Basophils Absolute: 0.1 10*3/uL (ref 0.0–0.1)
Basophils Relative: 1.2 % (ref 0.0–3.0)
Eosinophils Absolute: 0.3 10*3/uL (ref 0.0–0.7)
Eosinophils Relative: 4.4 % (ref 0.0–5.0)
HCT: 32.1 % — ABNORMAL LOW (ref 36.0–46.0)
Hemoglobin: 10.6 g/dL — ABNORMAL LOW (ref 12.0–15.0)
Lymphocytes Relative: 25.6 % (ref 12.0–46.0)
Lymphs Abs: 2 10*3/uL (ref 0.7–4.0)
MCHC: 33.2 g/dL (ref 30.0–36.0)
MCV: 80 fl (ref 78.0–100.0)
Monocytes Absolute: 0.5 10*3/uL (ref 0.1–1.0)
Monocytes Relative: 6.5 % (ref 3.0–12.0)
Neutro Abs: 4.8 10*3/uL (ref 1.4–7.7)
Neutrophils Relative %: 62.3 % (ref 43.0–77.0)
Platelets: 397 10*3/uL (ref 150.0–400.0)
RBC: 4.01 Mil/uL (ref 3.87–5.11)
RDW: 18.1 % — ABNORMAL HIGH (ref 11.5–15.5)
WBC: 7.8 10*3/uL (ref 4.0–10.5)

## 2020-01-13 LAB — IRON,TIBC AND FERRITIN PANEL
%SAT: 12 % (calc) — ABNORMAL LOW (ref 16–45)
Ferritin: 221 ng/mL (ref 16–288)
Iron: 42 ug/dL — ABNORMAL LOW (ref 45–160)
TIBC: 364 mcg/dL (calc) (ref 250–450)

## 2020-03-06 ENCOUNTER — Other Ambulatory Visit: Payer: Self-pay

## 2020-03-06 DIAGNOSIS — L821 Other seborrheic keratosis: Secondary | ICD-10-CM | POA: Diagnosis not present

## 2020-03-06 DIAGNOSIS — L603 Nail dystrophy: Secondary | ICD-10-CM | POA: Diagnosis not present

## 2020-03-06 DIAGNOSIS — L309 Dermatitis, unspecified: Secondary | ICD-10-CM | POA: Diagnosis not present

## 2020-03-07 DIAGNOSIS — H524 Presbyopia: Secondary | ICD-10-CM | POA: Diagnosis not present

## 2020-03-07 DIAGNOSIS — H5203 Hypermetropia, bilateral: Secondary | ICD-10-CM | POA: Diagnosis not present

## 2020-03-07 DIAGNOSIS — H43393 Other vitreous opacities, bilateral: Secondary | ICD-10-CM | POA: Diagnosis not present

## 2020-03-07 DIAGNOSIS — I10 Essential (primary) hypertension: Secondary | ICD-10-CM | POA: Diagnosis not present

## 2020-03-07 DIAGNOSIS — H35033 Hypertensive retinopathy, bilateral: Secondary | ICD-10-CM | POA: Diagnosis not present

## 2020-03-07 DIAGNOSIS — H52223 Regular astigmatism, bilateral: Secondary | ICD-10-CM | POA: Diagnosis not present

## 2020-03-14 ENCOUNTER — Encounter: Payer: Self-pay | Admitting: Gastroenterology

## 2020-03-14 ENCOUNTER — Ambulatory Visit (INDEPENDENT_AMBULATORY_CARE_PROVIDER_SITE_OTHER): Payer: Medicare Other | Admitting: Gastroenterology

## 2020-03-14 ENCOUNTER — Other Ambulatory Visit (INDEPENDENT_AMBULATORY_CARE_PROVIDER_SITE_OTHER): Payer: Medicare Other

## 2020-03-14 VITALS — BP 104/70 | HR 72 | Temp 99.6°F | Ht 62.0 in | Wt 131.0 lb

## 2020-03-14 DIAGNOSIS — K3189 Other diseases of stomach and duodenum: Secondary | ICD-10-CM | POA: Diagnosis not present

## 2020-03-14 DIAGNOSIS — Z86010 Personal history of colonic polyps: Secondary | ICD-10-CM

## 2020-03-14 DIAGNOSIS — D509 Iron deficiency anemia, unspecified: Secondary | ICD-10-CM

## 2020-03-14 LAB — CBC WITH DIFFERENTIAL/PLATELET
Basophils Absolute: 0.1 10*3/uL (ref 0.0–0.1)
Basophils Relative: 1.3 % (ref 0.0–3.0)
Eosinophils Absolute: 0.2 10*3/uL (ref 0.0–0.7)
Eosinophils Relative: 3.1 % (ref 0.0–5.0)
HCT: 33.8 % — ABNORMAL LOW (ref 36.0–46.0)
Hemoglobin: 11.3 g/dL — ABNORMAL LOW (ref 12.0–15.0)
Lymphocytes Relative: 17.6 % (ref 12.0–46.0)
Lymphs Abs: 1.4 10*3/uL (ref 0.7–4.0)
MCHC: 33.5 g/dL (ref 30.0–36.0)
MCV: 82.2 fl (ref 78.0–100.0)
Monocytes Absolute: 0.5 10*3/uL (ref 0.1–1.0)
Monocytes Relative: 5.9 % (ref 3.0–12.0)
Neutro Abs: 5.6 10*3/uL (ref 1.4–7.7)
Neutrophils Relative %: 72.1 % (ref 43.0–77.0)
Platelets: 357 10*3/uL (ref 150.0–400.0)
RBC: 4.11 Mil/uL (ref 3.87–5.11)
RDW: 15.9 % — ABNORMAL HIGH (ref 11.5–15.5)
WBC: 7.8 10*3/uL (ref 4.0–10.5)

## 2020-03-14 LAB — IBC + FERRITIN
Ferritin: 175.5 ng/mL (ref 10.0–291.0)
Iron: 41 ug/dL — ABNORMAL LOW (ref 42–145)
Saturation Ratios: 10.5 % — ABNORMAL LOW (ref 20.0–50.0)
Transferrin: 278 mg/dL (ref 212.0–360.0)

## 2020-03-14 NOTE — Patient Instructions (Addendum)
If you are age 79 or older, your body mass index should be between 23-30. Your Body mass index is 23.96 kg/m. If this is out of the aforementioned range listed, please consider follow up with your Primary Care Provider.  If you are age 82 or younger, your body mass index should be between 19-25. Your Body mass index is 23.96 kg/m. If this is out of the aformentioned range listed, please consider follow up with your Primary Care Provider.   Please go to the lab in the basement of our building to have lab work done as you leave today. Hit "B" for basement when you get on the elevator.  When the doors open the lab is on your left.  We will call you with the results. Thank you.   You will be due for an EGD in 12-2020. We will remind you when it is time to schedule your procedure.  Thank you for entrusting me with your care and for choosing [facility], Dr. [place] Cellar

## 2020-03-14 NOTE — Progress Notes (Signed)
HPI :  79 year old female with a history of iron deficiency anemia, here for follow-up visit.  She was referred last November with heme positive stool, hemoglobin of 10.7 with an iron sat of 7%, MCV of 84.  She was started on oral iron and referred for EGD and colonoscopy.  Results as outlined:  EGD 12/21/19 -  - A 4 cm hiatal hernia was present. - Slightly nodular mucosa was noted just inferior to the gastroesophageal junction in the hernia sac, I suspect inflammatory / reactive to reflux / hernia. Biopsies were taken with a cold forceps for histology. - The exam of the esophagus was otherwise normal. - A single 8 mm subepithelial nodule was found in the proximal gastric fundus / cardia. Initially seen easily on retroflexion during the start of the exam, upon with drawing the scope with the hernia sac insufflated, it appeared to be at the distal end of the hernia sac. Hard to palpation. Bite on bite biopsies were taken with a cold forceps for histology. - The exam of the stomach was otherwise normal. - Biopsies were taken with a cold forceps in the gastric body, at the incisura and in the gastric antrum for Helicobacter pylori testing. - The duodenal bulb and second portion of the duodenum were normal.  Colonoscopy 12/21/19 - The perianal and digital rectal examinations were normal. - The terminal ileum appeared normal. - The colon was tortuous. - Two sessile polyps were found in the cecum. The polyps were 3 and 10-12 mm in size. These polyps were removed with a cold snare. Resection and retrieval were complete. - A 5 mm polyp was found in the transverse colon. The polyp was sessile. The polyp was removed with a cold snare. Resection and retrieval were complete. - A 3 mm polyp was found in the descending colon. The polyp was sessile. The polyp was removed with a cold snare. Resection and retrieval were complete. - Multiple small-mouthed diverticula were found in the sigmoid colon and  transverse colon. - Three sessile polyps were found in the rectum. The polyps were 3 to 4 mm in size. These polyps were removed with a cold snare. Resection and retrieval were complete. - The exam was otherwise without abnormality.  Diagnosis 1. Surgical [P], gastric antrum and gastric body - GASTRIC ANTRAL AND OXYNTIC MUCOSA WITH MILD CHRONIC GASTRITIS. - WARTHIN-STARRY STAIN IS NEGATIVE FOR HELICOBACTER PYLORI. 2. Surgical [P], stomach nodule - GASTRIC MUCOSA WITH SUPERFICIAL HYPERPLASTIC CHANGES. - NO SUBMUCOSA PRESENT FOR EVALUATION. 3. Surgical [P], esophagus, GE junction - SQUAMOCOLUMNAR ESOPHAGEAL MUCOSA WITH REACTIVE/REGENERATIVE CHANGES. - NEGATIVE FOR INTESTINAL METAPLASIA (GOBLET CELL METAPLASIA). 4. Surgical [P], colon, rectum, polyp (3) - HYPERPLASTIC POLYP (X3). 5. Surgical [P], colon, cecum, transverse, descending, polyp (4) - TUBULAR ADENOMA WITHOUT HIGH GRADE DYSPLASIA (X MULTIPLE). - SESSILE SERRATED POLYP WITHOUT CYTOLOGIC DYSPLASIA (X MULTIPLE).   Unclear cause for iron deficiency based on EGD and colonoscopy.  We decided to assess response to her oral iron.  Hemoglobin about the same at 10.6, MCV 80, iron sat improved to 12%.  Ferritin was 221.  We discussed options at the time, decided to increase her iron dose to 3 times daily and schedule follow-up with me.  I had otherwise reviewed her endoscopy with my advanced endoscopy colleagues, regarding gastric nodule, they had recommended holding off on EUS and consideration for repeat EGD in 1 year.  Patient states she has been taking iron 3 times a day over the past 2 months now and feeling  pretty well.  She denies any constipation with this.  No abdominal pains.  She denies any problems eating.  She has gained a few pounds recently, no weight loss.  She denies any NSAID use or use of blood thinners.  We otherwise discussed her colonoscopy and history of colon polyps.  She states her aunts died of colonoscopy related  complications at an older age and she is fairly sure she does not want any further colonoscopies in her lifetime.    Past Medical History:  Diagnosis Date  . Allergy    seasonal allergies, pcn  . Arthritis   . Cataract 2012   bilateral extractions.lens implant  . Hyperlipidemia 2005   on medication  . Hypertension   . IDA (iron deficiency anemia)   . Murmur   . Osteoporosis      Past Surgical History:  Procedure Laterality Date  . CATARACT EXTRACTION, BILATERAL    . COLONOSCOPY     2005  . COSMETIC SURGERY     face after accident childhood- extensive per  . TONSILLECTOMY    . TOTAL KNEE ARTHROPLASTY     bilat   Family History  Problem Relation Age of Onset  . Colon cancer Maternal Aunt   . Diabetes Maternal Grandfather   . Breast cancer Maternal Aunt   . Diabetes Maternal Aunt   . Esophageal cancer Neg Hx   . Rectal cancer Neg Hx   . Stomach cancer Neg Hx    Social History   Tobacco Use  . Smoking status: Former Research scientist (life sciences)  . Smokeless tobacco: Never Used  Substance Use Topics  . Alcohol use: Yes    Alcohol/week: 1.0 standard drinks    Types: 1 Glasses of wine per week    Comment: or burbon  . Drug use: No   Current Outpatient Medications  Medication Sig Dispense Refill  . alendronate (FOSAMAX) 70 MG tablet Take 70 mg by mouth once a week. Take with a full glass of water on an empty stomach.    Marland Kitchen atorvastatin (LIPITOR) 10 MG tablet Take 10 mg by mouth daily.    Marland Kitchen CALCIUM CITRATE PO Take 1 tablet by mouth daily.    . Cholecalciferol (VITAMIN D3) 50 MCG (2000 UT) TABS Take 1 tablet by mouth daily.    . Coenzyme Q10 (COQ10) 100 MG CAPS Take 1 capsule by mouth daily.    . Cyanocobalamin (B-12) 2500 MCG TABS Take 1 tablet by mouth daily.    . diclofenac Sodium (VOLTAREN) 1 % GEL Apply 1 application topically as needed.    . docusate sodium (COLACE) 50 MG capsule Take 50 mg by mouth daily.    . ferrous sulfate 325 (65 FE) MG EC tablet Take 1 tablet (325 mg total)  by mouth 2 (two) times daily. Take three times a day if tolerated    . lisinopril-hydrochlorothiazide (ZESTORETIC) 20-25 MG tablet Take 1 tablet by mouth daily.    Marland Kitchen loratadine (CLARITIN) 10 MG tablet Take 10 mg by mouth daily as needed for allergies.    . Magnesium 250 MG TABS Take 1 tablet by mouth daily.    . Multiple Vitamins-Minerals (ICAPS AREDS 2 PO) Take 1 capsule by mouth daily.    Marland Kitchen zolpidem (AMBIEN) 10 MG tablet Take 10 mg by mouth at bedtime as needed for sleep.     No current facility-administered medications for this visit.   Allergies  Allergen Reactions  . Penicillins Rash    "long time ago"  Review of Systems: All systems reviewed and negative except where noted in HPI.   Lab Results  Component Value Date   WBC 7.8 01/13/2020   HGB 10.6 (L) 01/13/2020   HCT 32.1 (L) 01/13/2020   MCV 80.0 01/13/2020   PLT 397.0 01/13/2020    Lab Results  Component Value Date   IRON 42 (L) 01/13/2020   TIBC 364 01/13/2020   FERRITIN 221 01/13/2020    Physical Exam: BP 104/70   Pulse 72   Temp 99.6 F (37.6 C)   Ht 5\' 2"  (1.575 m)   Wt 131 lb (59.4 kg)   BMI 23.96 kg/m  Constitutional: Pleasant,well-developed, female in no acute distress. Neurological: Alert and oriented to person place and time. Skin: Skin is warm and dry. No rashes noted. Psychiatric: Normal mood and affect. Behavior is normal.   ASSESSMENT AND PLAN: 79 year old female here for reassessment of following:  Iron deficiency anemia - unclear etiology based off initial EGD and colonoscopy, no obvious pathology noted.  No evidence of H. pylori.  No concerning pathology otherwise.  She has increased her dosing of oral iron to 3 times daily for the past few months, recommend repeating her CBC and iron studies at this time.  If she has normalized her iron studies and hemoglobin then we may not pursue any further evaluation.  However if she still remains iron deficient with anemia, would pursue IV iron  infusion and then would consider capsule endoscopy to clear the small bowel.  I discussed capsule endoscopy with her, risks and benefits.  She wants to avoid this if she can however if she is having worsening anemia she may consider it.  Will await labs and let her know the result.  She agreed  Gastric nodule - subepithelial gastric nodule noted on EGD.  Discussed that this is very likely a benign entity that will never cause her problem, however there is a very small chance it could be a lesion with potential to cause problems such as carcinoid etc. I have discussed her case with my advanced endoscopy colleagues regarding possible EUS.  They recommended holding off on that for now and consideration for repeat EGD 1 year after her last exam given the small size of it.  She is agreeable with a follow-up EGD in 12/2020.  If she has any issues in the interim she will let me know.  History of colon polyps - reviewed pathology results and findings from her last colonoscopy.  At her age she strongly does not wish to have any further colonoscopy surveillance exams after discussion of risks and benefits.  We will cancel future surveillance recalls  She agreed  [place] Cellar, MD [facility]

## 2020-03-15 ENCOUNTER — Other Ambulatory Visit: Payer: Self-pay

## 2020-03-15 DIAGNOSIS — D509 Iron deficiency anemia, unspecified: Secondary | ICD-10-CM

## 2020-06-21 ENCOUNTER — Other Ambulatory Visit (INDEPENDENT_AMBULATORY_CARE_PROVIDER_SITE_OTHER): Payer: Medicare Other

## 2020-06-21 DIAGNOSIS — D509 Iron deficiency anemia, unspecified: Secondary | ICD-10-CM

## 2020-06-21 LAB — CBC
HCT: 32.6 % — ABNORMAL LOW (ref 36.0–46.0)
Hemoglobin: 10.8 g/dL — ABNORMAL LOW (ref 12.0–15.0)
MCHC: 33.2 g/dL (ref 30.0–36.0)
MCV: 86.9 fl (ref 78.0–100.0)
Platelets: 299 10*3/uL (ref 150.0–400.0)
RBC: 3.75 Mil/uL — ABNORMAL LOW (ref 3.87–5.11)
RDW: 14.5 % (ref 11.5–15.5)
WBC: 7.4 10*3/uL (ref 4.0–10.5)

## 2020-06-22 ENCOUNTER — Telehealth: Payer: Self-pay | Admitting: Gastroenterology

## 2020-06-22 ENCOUNTER — Other Ambulatory Visit: Payer: Self-pay

## 2020-06-22 DIAGNOSIS — E539 Vitamin B deficiency, unspecified: Secondary | ICD-10-CM

## 2020-06-22 DIAGNOSIS — D509 Iron deficiency anemia, unspecified: Secondary | ICD-10-CM

## 2020-06-22 NOTE — Telephone Encounter (Signed)
Patient returned your call about results, please call patient one more time.   

## 2020-06-22 NOTE — Telephone Encounter (Signed)
Spoke with patient, see result note from 06/21/20 for more information.

## 2020-06-27 ENCOUNTER — Other Ambulatory Visit (INDEPENDENT_AMBULATORY_CARE_PROVIDER_SITE_OTHER): Payer: Medicare Other

## 2020-06-27 DIAGNOSIS — E539 Vitamin B deficiency, unspecified: Secondary | ICD-10-CM

## 2020-06-27 DIAGNOSIS — D509 Iron deficiency anemia, unspecified: Secondary | ICD-10-CM

## 2020-06-27 LAB — FOLATE: Folate: >24.8 ng/mL (ref >5.9)

## 2020-06-27 LAB — IBC + FERRITIN
Ferritin: 155.1 ng/mL (ref 10.0–291.0)
Iron: 57 ug/dL (ref 42–145)
Saturation Ratios: 13.6 % — ABNORMAL LOW (ref 20.0–50.0)
Transferrin: 299 mg/dL (ref 212.0–360.0)

## 2020-06-27 LAB — VITAMIN B12: Vitamin B-12: >1526 pg/mL — ABNORMAL HIGH (ref 211–911)

## 2020-06-28 ENCOUNTER — Other Ambulatory Visit: Payer: Self-pay

## 2020-06-28 DIAGNOSIS — D509 Iron deficiency anemia, unspecified: Secondary | ICD-10-CM

## 2020-07-23 DIAGNOSIS — Z23 Encounter for immunization: Secondary | ICD-10-CM | POA: Diagnosis not present

## 2020-08-02 ENCOUNTER — Telehealth: Payer: Self-pay

## 2020-08-02 NOTE — Telephone Encounter (Signed)
Spoke with patient to remind her that she is due for repeat labs. Pt states that she is currently moving and will have to come by once they are done. Advised that no appt is necessary and that she can go by between 7:30-5, Monday through Friday

## 2020-08-02 NOTE — Telephone Encounter (Signed)
-----   Message from Marlon Pel, RN sent at 08/02/2020  9:09 AM EDT ----- Regarding: FW: Labs  ----- Message ----- From: Yevette Edwards, RN Sent: 08/02/2020 To: Marlon Pel, RN Subject: Labs                                           Repeat CBC, order in epic

## 2020-08-16 ENCOUNTER — Other Ambulatory Visit (INDEPENDENT_AMBULATORY_CARE_PROVIDER_SITE_OTHER): Payer: Medicare Other

## 2020-08-16 DIAGNOSIS — D509 Iron deficiency anemia, unspecified: Secondary | ICD-10-CM

## 2020-08-16 LAB — CBC
HCT: 33.5 % — ABNORMAL LOW (ref 36.0–46.0)
Hemoglobin: 11.3 g/dL — ABNORMAL LOW (ref 12.0–15.0)
MCHC: 33.7 g/dL (ref 30.0–36.0)
MCV: 86.5 fl (ref 78.0–100.0)
Platelets: 324 10*3/uL (ref 150.0–400.0)
RBC: 3.88 Mil/uL (ref 3.87–5.11)
RDW: 14.2 % (ref 11.5–15.5)
WBC: 8 10*3/uL (ref 4.0–10.5)

## 2020-08-18 DIAGNOSIS — Z23 Encounter for immunization: Secondary | ICD-10-CM | POA: Diagnosis not present

## 2020-09-12 ENCOUNTER — Encounter: Payer: Self-pay | Admitting: Gastroenterology

## 2020-09-12 ENCOUNTER — Ambulatory Visit (INDEPENDENT_AMBULATORY_CARE_PROVIDER_SITE_OTHER): Payer: Medicare Other | Admitting: Gastroenterology

## 2020-09-12 VITALS — BP 118/70 | HR 94 | Ht 62.0 in | Wt 134.0 lb

## 2020-09-12 DIAGNOSIS — K3189 Other diseases of stomach and duodenum: Secondary | ICD-10-CM | POA: Diagnosis not present

## 2020-09-12 DIAGNOSIS — D509 Iron deficiency anemia, unspecified: Secondary | ICD-10-CM

## 2020-09-12 NOTE — Patient Instructions (Signed)
If you are age 79 or older, your body mass index should be between 23-30. Your Body mass index is 24.51 kg/m. If this is out of the aforementioned range listed, please consider follow up with your Primary Care Provider.  If you are age 63 or younger, your body mass index should be between 19-25. Your Body mass index is 24.51 kg/m. If this is out of the aformentioned range listed, please consider follow up with your Primary Care Provider.   Continue Iron supplements   You will be due for labs in December. We will call you to remind you in December.   Thank you for entrusting me with your care and for choosing [facility], Dr. [place] Cellar

## 2020-09-12 NOTE — Progress Notes (Signed)
HPI :  79 year old female with a history of iron deficiency anemia, here for follow-up visit.  She was referred last November 2020 with heme positive stool, hemoglobin of 10.7 with an iron sat of 7%, MCV of 84.  She was started on oral iron and referred for EGD and colonoscopy.  Results as outlined:  EGD 12/21/19 -  - A 4 cm hiatal hernia was present. - Slightly nodular mucosa was noted just inferior to the gastroesophageal junction in the hernia sac, I suspect inflammatory / reactive to reflux / hernia. Biopsies were taken with a cold forceps for histology. - The exam of the esophagus was otherwise normal. - A single 8 mm subepithelial nodule was found in the proximal gastric fundus / cardia. Initially seen easily on retroflexion during the start of the exam, upon with drawing the scope with the hernia sac insufflated, it appeared to be at the distal end of the hernia sac. Hard to palpation. Bite on bite biopsies were taken with a cold forceps for histology. - The exam of the stomach was otherwise normal. - Biopsies were taken with a cold forceps in the gastric body, at the incisura and in the gastric antrum for Helicobacter pylori testing. - The duodenal bulb and second portion of the duodenum were normal.  Colonoscopy 12/21/19 - The perianal and digital rectal examinations were normal. - The terminal ileum appeared normal. - The colon was tortuous. - Two sessile polyps were found in the cecum. The polyps were 3 and 10-12 mm in size. These polyps were removed with a cold snare. Resection and retrieval were complete. - A 5 mm polyp was found in the transverse colon. The polyp was sessile. The polyp was removed with a cold snare. Resection and retrieval were complete. - A 3 mm polyp was found in the descending colon. The polyp was sessile. The polyp was removed with a cold snare. Resection and retrieval were complete. - Multiple small-mouthed diverticula were found in the sigmoid colon  and transverse colon. - Three sessile polyps were found in the rectum. The polyps were 3 to 4 mm in size. These polyps were removed with a cold snare. Resection and retrieval were complete. - The exam was otherwise without abnormality.   1. Surgical [P], gastric antrum and gastric body - GASTRIC ANTRAL AND OXYNTIC MUCOSA WITH MILD CHRONIC GASTRITIS. - WARTHIN-STARRY STAIN IS NEGATIVE FOR HELICOBACTER PYLORI. 2. Surgical [P], stomach nodule - GASTRIC MUCOSA WITH SUPERFICIAL HYPERPLASTIC CHANGES. - NO SUBMUCOSA PRESENT FOR EVALUATION. 3. Surgical [P], esophagus, GE junction - SQUAMOCOLUMNAR ESOPHAGEAL MUCOSA WITH REACTIVE/REGENERATIVE CHANGES. - NEGATIVE FOR INTESTINAL METAPLASIA (GOBLET CELL METAPLASIA). 4. Surgical [P], colon, rectum, polyp (3) - HYPERPLASTIC POLYP (X3). 5. Surgical [P], colon, cecum, transverse, descending, polyp (4) - TUBULAR ADENOMA WITHOUT HIGH GRADE DYSPLASIA (X MULTIPLE). - SESSILE SERRATED POLYP WITHOUT CYTOLOGIC DYSPLASIA (X MULTIPLE).   Following these exams it was unclear as to the cause for her iron deficiency.  We decided to see how she did on oral iron, for which she is taking 3 times a day.  We discussed options at the last visit with consideration for capsule endoscopy pending her course.  She really wanted to avoid that if at all possible.  Her last hemoglobin on September 29 was 11.3, MCV of 86.5.  2 months before that it was 10.8, and 6 months ago it was 11.3, hemoglobin has been relatively stable.  Iron studies in August showed a slightly low iron sat at 13.6 however her total  iron level and ferritin were normal on oral iron.  B12 levels normal.  She states she has a lot going on at home right now, her husband has prostate cancer and hearing loss, she is in the process of moving and does not want to pursue invasive test right now.  She denies any blood in her stools.  She denies any abdominal pains.  No weight loss.  She denies any NSAIDs.  She was using  Voltaren gel but is avoiding that as well.  We otherwise discussed her colonoscopy and history of colon polyps.  She states her aunts died of colonoscopy related complications at an older age and she is fairly sure she does not want any further colonoscopies in her lifetime.    Past Medical History:  Diagnosis Date  . Allergy    seasonal allergies, pcn  . Arthritis   . Cataract 2012   bilateral extractions.lens implant  . Hyperlipidemia 2005   on medication  . Hypertension   . IDA (iron deficiency anemia)   . Murmur   . Osteoporosis      Past Surgical History:  Procedure Laterality Date  . CATARACT EXTRACTION, BILATERAL    . COLONOSCOPY     2005  . COSMETIC SURGERY     face after accident childhood- extensive per  . TONSILLECTOMY    . TOTAL KNEE ARTHROPLASTY     bilat   Family History  Problem Relation Age of Onset  . Colon cancer Maternal Aunt   . Diabetes Maternal Grandfather   . Breast cancer Maternal Aunt   . Diabetes Maternal Aunt   . Esophageal cancer Neg Hx   . Rectal cancer Neg Hx   . Stomach cancer Neg Hx    Social History   Tobacco Use  . Smoking status: Former Research scientist (life sciences)  . Smokeless tobacco: Never Used  Vaping Use  . Vaping Use: Never used  Substance Use Topics  . Alcohol use: Yes    Alcohol/week: 1.0 standard drink    Types: 1 Glasses of wine per week    Comment: or burbon  . Drug use: No   Current Outpatient Medications  Medication Sig Dispense Refill  . alendronate (FOSAMAX) 70 MG tablet Take 70 mg by mouth once a week. Take with a full glass of water on an empty stomach.    Marland Kitchen atorvastatin (LIPITOR) 10 MG tablet Take 10 mg by mouth daily.    Marland Kitchen CALCIUM CITRATE PO Take 1 tablet by mouth daily.    . Cholecalciferol (VITAMIN D3) 50 MCG (2000 UT) TABS Take 1 tablet by mouth daily.    . Coenzyme Q10 (COQ10) 100 MG CAPS Take 1 capsule by mouth daily.    . Cyanocobalamin (B-12) 2500 MCG TABS Take 1 tablet by mouth daily.    . diclofenac Sodium  (VOLTAREN) 1 % GEL Apply 1 application topically as needed.    . docusate sodium (COLACE) 50 MG capsule Take 50 mg by mouth daily.    . ferrous sulfate 325 (65 FE) MG EC tablet Take 1 tablet (325 mg total) by mouth 2 (two) times daily. Take three times a day if tolerated    . lisinopril-hydrochlorothiazide (ZESTORETIC) 20-25 MG tablet Take 1 tablet by mouth daily.    Marland Kitchen loratadine (CLARITIN) 10 MG tablet Take 10 mg by mouth daily as needed for allergies.    . Magnesium 250 MG TABS Take 1 tablet by mouth daily.    . Multiple Vitamins-Minerals (ICAPS AREDS 2 PO) Take 1  capsule by mouth daily.    Marland Kitchen zolpidem (AMBIEN) 10 MG tablet Take 10 mg by mouth at bedtime as needed for sleep.     No current facility-administered medications for this visit.   Allergies  Allergen Reactions  . Penicillins Rash    "long time ago"     Review of Systems: All systems reviewed and negative except where noted in HPI.   CBC Latest Ref Rng & Units 08/16/2020 06/21/2020 03/14/2020  WBC 4.0 - 10.5 K/uL 8.0 7.4 7.8  Hemoglobin 12.0 - 15.0 g/dL 11.3(L) 10.8(L) 11.3(L)  Hematocrit 36 - 46 % 33.5(L) 32.6(L) 33.8(L)  Platelets 150 - 400 K/uL 324.0 299.0 357.0    Lab Results  Component Value Date   IRON 57 06/27/2020   TIBC 364 01/13/2020   FERRITIN 155.1 06/27/2020     Physical Exam: BP 118/70   Pulse 94   Ht 5\' 2"  (1.575 m)   Wt 134 lb (60.8 kg)   SpO2 99%   BMI 24.51 kg/m  Constitutional: Pleasant,well-developed, female in no acute distress. Neurological: Alert and oriented to person place and time. Skin: Skin is warm and dry. No rashes noted. Psychiatric: Normal mood and affect. Behavior is normal.   ASSESSMENT AND PLAN: 79 year old female here for reassessment the following:  Iron deficiency anemia - no clear cause on initial EGD and colonoscopy.  She is tolerating oral iron and her hemoglobin has remained stable in the 11's, iron studies look good with slightly low iron sat.  I explained to her  that quite possible she has a small bowel source, normally would consider evaluation for this with capsule endoscopy or an enterography imaging study.  I discussed both of these with her.  She really does not want to pursue any further invasive testing if at all possible if her hemoglobin stays normal.  She understands the risks of not completely evaluating this, hopefully risk of malignancy is low given she otherwise feels well without symptoms.  I will repeat her CBC in a few months for reassessment, she will continue her iron supplement otherwise for now.  Gastric nodule - benign-appearing subepithelial nodule noted on prior EGD.  I previously discussed her case with my advanced endoscopy colleagues who recommended 1 year follow-up EGD in would hold off on EUS unless enlarged.  I discussed that this is very likely a benign entity however could potentially be a lesion with potential to cause problems such as a carcinoid.  She is agreeable to an EGD in February 2022 for surveillance.  Simonton Cellar, MD [facility]

## 2020-09-22 DIAGNOSIS — R7301 Impaired fasting glucose: Secondary | ICD-10-CM | POA: Diagnosis not present

## 2020-09-22 DIAGNOSIS — E785 Hyperlipidemia, unspecified: Secondary | ICD-10-CM | POA: Diagnosis not present

## 2020-09-22 DIAGNOSIS — M859 Disorder of bone density and structure, unspecified: Secondary | ICD-10-CM | POA: Diagnosis not present

## 2020-09-25 DIAGNOSIS — D649 Anemia, unspecified: Secondary | ICD-10-CM | POA: Diagnosis not present

## 2020-09-25 DIAGNOSIS — R202 Paresthesia of skin: Secondary | ICD-10-CM | POA: Diagnosis not present

## 2020-09-29 DIAGNOSIS — Z1331 Encounter for screening for depression: Secondary | ICD-10-CM | POA: Diagnosis not present

## 2020-09-29 DIAGNOSIS — R7301 Impaired fasting glucose: Secondary | ICD-10-CM | POA: Diagnosis not present

## 2020-09-29 DIAGNOSIS — I10 Essential (primary) hypertension: Secondary | ICD-10-CM | POA: Diagnosis not present

## 2020-09-29 DIAGNOSIS — M8580 Other specified disorders of bone density and structure, unspecified site: Secondary | ICD-10-CM | POA: Diagnosis not present

## 2020-09-29 DIAGNOSIS — D509 Iron deficiency anemia, unspecified: Secondary | ICD-10-CM | POA: Diagnosis not present

## 2020-09-29 DIAGNOSIS — F17201 Nicotine dependence, unspecified, in remission: Secondary | ICD-10-CM | POA: Diagnosis not present

## 2020-09-29 DIAGNOSIS — Z1339 Encounter for screening examination for other mental health and behavioral disorders: Secondary | ICD-10-CM | POA: Diagnosis not present

## 2020-09-29 DIAGNOSIS — M2550 Pain in unspecified joint: Secondary | ICD-10-CM | POA: Diagnosis not present

## 2020-09-29 DIAGNOSIS — I499 Cardiac arrhythmia, unspecified: Secondary | ICD-10-CM | POA: Diagnosis not present

## 2020-09-29 DIAGNOSIS — E785 Hyperlipidemia, unspecified: Secondary | ICD-10-CM | POA: Diagnosis not present

## 2020-09-29 DIAGNOSIS — Z0000 Encounter for general adult medical examination without abnormal findings: Secondary | ICD-10-CM | POA: Diagnosis not present

## 2020-10-13 ENCOUNTER — Other Ambulatory Visit (HOSPITAL_COMMUNITY): Payer: Self-pay | Admitting: *Deleted

## 2020-10-16 ENCOUNTER — Other Ambulatory Visit: Payer: Self-pay

## 2020-10-16 ENCOUNTER — Ambulatory Visit (HOSPITAL_COMMUNITY)
Admission: RE | Admit: 2020-10-16 | Discharge: 2020-10-16 | Disposition: A | Discharge status: Home / Self Care | Payer: Medicare Other | Source: Ambulatory Visit | Attending: Internal Medicine | Admitting: Internal Medicine

## 2020-10-16 DIAGNOSIS — M810 Age-related osteoporosis without current pathological fracture: Secondary | ICD-10-CM | POA: Diagnosis not present

## 2020-10-16 MED ORDER — ZOLEDRONIC ACID 5 MG/100ML IV SOLN
5.0000 mg | Freq: Once | INTRAVENOUS | Status: AC
  Administered 2020-10-16: 5 mg via INTRAVENOUS

## 2020-10-16 MED ORDER — ZOLEDRONIC ACID 5 MG/100ML IV SOLN
INTRAVENOUS | Status: AC
  Filled 2020-10-16: qty 100

## 2020-10-16 NOTE — Discharge Instructions (Signed)

## 2020-11-02 ENCOUNTER — Telehealth: Payer: Self-pay

## 2020-11-02 DIAGNOSIS — D509 Iron deficiency anemia, unspecified: Secondary | ICD-10-CM

## 2020-11-02 NOTE — Telephone Encounter (Signed)
Called and spoke to pt.  She will go to the lab after the new year. She is busy with family activities until then. She is doing well and taking her iron twice a day and tolerating it fine.

## 2020-11-02 NOTE — Telephone Encounter (Signed)
-----   Message from Darrall Dears, [place] sent at 09/12/2020  1:55 PM EDT ----- Regarding: Recall labs Patient is due for labs in December

## 2020-11-21 ENCOUNTER — Other Ambulatory Visit (INDEPENDENT_AMBULATORY_CARE_PROVIDER_SITE_OTHER): Payer: Medicare Other

## 2020-11-21 DIAGNOSIS — D509 Iron deficiency anemia, unspecified: Secondary | ICD-10-CM

## 2020-11-21 LAB — CBC WITH DIFFERENTIAL/PLATELET
Basophils Absolute: 0.1 10*3/uL (ref 0.0–0.1)
Basophils Relative: 0.9 % (ref 0.0–3.0)
Eosinophils Absolute: 0.2 10*3/uL (ref 0.0–0.7)
Eosinophils Relative: 2.9 % (ref 0.0–5.0)
HCT: 35.3 % — ABNORMAL LOW (ref 36.0–46.0)
Hemoglobin: 11.8 g/dL — ABNORMAL LOW (ref 12.0–15.0)
Lymphocytes Relative: 26.6 % (ref 12.0–46.0)
Lymphs Abs: 2.3 10*3/uL (ref 0.7–4.0)
MCHC: 33.5 g/dL (ref 30.0–36.0)
MCV: 86.5 fl (ref 78.0–100.0)
Monocytes Absolute: 0.6 10*3/uL (ref 0.1–1.0)
Monocytes Relative: 6.8 % (ref 3.0–12.0)
Neutro Abs: 5.3 10*3/uL (ref 1.4–7.7)
Neutrophils Relative %: 62.8 % (ref 43.0–77.0)
Platelets: 312 10*3/uL (ref 150.0–400.0)
RBC: 4.08 Mil/uL (ref 3.87–5.11)
RDW: 13.1 % (ref 11.5–15.5)
WBC: 8.5 10*3/uL (ref 4.0–10.5)

## 2021-01-15 ENCOUNTER — Encounter: Payer: Medicare Other | Admitting: Gastroenterology

## 2021-01-26 ENCOUNTER — Encounter: Payer: Self-pay | Admitting: Gastroenterology

## 2021-02-16 DIAGNOSIS — Z23 Encounter for immunization: Secondary | ICD-10-CM | POA: Diagnosis not present

## 2021-07-26 DIAGNOSIS — Z23 Encounter for immunization: Secondary | ICD-10-CM | POA: Diagnosis not present

## 2021-12-10 DIAGNOSIS — D509 Iron deficiency anemia, unspecified: Secondary | ICD-10-CM | POA: Diagnosis not present

## 2021-12-10 DIAGNOSIS — M859 Disorder of bone density and structure, unspecified: Secondary | ICD-10-CM | POA: Diagnosis not present

## 2021-12-10 DIAGNOSIS — M8550 Aneurysmal bone cyst, unspecified site: Secondary | ICD-10-CM | POA: Diagnosis not present

## 2021-12-10 DIAGNOSIS — I10 Essential (primary) hypertension: Secondary | ICD-10-CM | POA: Diagnosis not present

## 2021-12-10 DIAGNOSIS — M8589 Other specified disorders of bone density and structure, multiple sites: Secondary | ICD-10-CM | POA: Diagnosis not present

## 2021-12-10 DIAGNOSIS — R7301 Impaired fasting glucose: Secondary | ICD-10-CM | POA: Diagnosis not present

## 2021-12-10 DIAGNOSIS — E785 Hyperlipidemia, unspecified: Secondary | ICD-10-CM | POA: Diagnosis not present

## 2021-12-17 DIAGNOSIS — I10 Essential (primary) hypertension: Secondary | ICD-10-CM | POA: Diagnosis not present

## 2021-12-17 DIAGNOSIS — F17201 Nicotine dependence, unspecified, in remission: Secondary | ICD-10-CM | POA: Diagnosis not present

## 2021-12-17 DIAGNOSIS — Z1331 Encounter for screening for depression: Secondary | ICD-10-CM | POA: Diagnosis not present

## 2021-12-17 DIAGNOSIS — Z1339 Encounter for screening examination for other mental health and behavioral disorders: Secondary | ICD-10-CM | POA: Diagnosis not present

## 2021-12-17 DIAGNOSIS — D509 Iron deficiency anemia, unspecified: Secondary | ICD-10-CM | POA: Diagnosis not present

## 2021-12-17 DIAGNOSIS — R7301 Impaired fasting glucose: Secondary | ICD-10-CM | POA: Diagnosis not present

## 2021-12-17 DIAGNOSIS — E785 Hyperlipidemia, unspecified: Secondary | ICD-10-CM | POA: Diagnosis not present

## 2021-12-17 DIAGNOSIS — M8580 Other specified disorders of bone density and structure, unspecified site: Secondary | ICD-10-CM | POA: Diagnosis not present

## 2021-12-17 DIAGNOSIS — Z0000 Encounter for general adult medical examination without abnormal findings: Secondary | ICD-10-CM | POA: Diagnosis not present

## 2022-02-08 DIAGNOSIS — D509 Iron deficiency anemia, unspecified: Secondary | ICD-10-CM | POA: Diagnosis not present

## 2022-08-08 DIAGNOSIS — Z23 Encounter for immunization: Secondary | ICD-10-CM | POA: Diagnosis not present

## 2022-08-13 DIAGNOSIS — Z961 Presence of intraocular lens: Secondary | ICD-10-CM | POA: Diagnosis not present

## 2022-08-13 DIAGNOSIS — H35371 Puckering of macula, right eye: Secondary | ICD-10-CM | POA: Diagnosis not present

## 2022-12-24 DIAGNOSIS — D649 Anemia, unspecified: Secondary | ICD-10-CM | POA: Diagnosis not present

## 2022-12-24 DIAGNOSIS — I10 Essential (primary) hypertension: Secondary | ICD-10-CM | POA: Diagnosis not present

## 2022-12-24 DIAGNOSIS — R7301 Impaired fasting glucose: Secondary | ICD-10-CM | POA: Diagnosis not present

## 2022-12-24 DIAGNOSIS — R7989 Other specified abnormal findings of blood chemistry: Secondary | ICD-10-CM | POA: Diagnosis not present

## 2022-12-24 DIAGNOSIS — M810 Age-related osteoporosis without current pathological fracture: Secondary | ICD-10-CM | POA: Diagnosis not present

## 2022-12-24 DIAGNOSIS — E785 Hyperlipidemia, unspecified: Secondary | ICD-10-CM | POA: Diagnosis not present

## 2022-12-24 DIAGNOSIS — D509 Iron deficiency anemia, unspecified: Secondary | ICD-10-CM | POA: Diagnosis not present

## 2022-12-31 ENCOUNTER — Telehealth: Payer: Self-pay

## 2022-12-31 DIAGNOSIS — M8580 Other specified disorders of bone density and structure, unspecified site: Secondary | ICD-10-CM | POA: Diagnosis not present

## 2022-12-31 DIAGNOSIS — R7301 Impaired fasting glucose: Secondary | ICD-10-CM | POA: Diagnosis not present

## 2022-12-31 DIAGNOSIS — D509 Iron deficiency anemia, unspecified: Secondary | ICD-10-CM | POA: Diagnosis not present

## 2022-12-31 DIAGNOSIS — E785 Hyperlipidemia, unspecified: Secondary | ICD-10-CM | POA: Diagnosis not present

## 2022-12-31 DIAGNOSIS — Z0000 Encounter for general adult medical examination without abnormal findings: Secondary | ICD-10-CM | POA: Diagnosis not present

## 2022-12-31 DIAGNOSIS — R82998 Other abnormal findings in urine: Secondary | ICD-10-CM | POA: Diagnosis not present

## 2022-12-31 DIAGNOSIS — Z1331 Encounter for screening for depression: Secondary | ICD-10-CM | POA: Diagnosis not present

## 2022-12-31 DIAGNOSIS — Z1339 Encounter for screening examination for other mental health and behavioral disorders: Secondary | ICD-10-CM | POA: Diagnosis not present

## 2022-12-31 DIAGNOSIS — I10 Essential (primary) hypertension: Secondary | ICD-10-CM | POA: Diagnosis not present

## 2022-12-31 DIAGNOSIS — R011 Cardiac murmur, unspecified: Secondary | ICD-10-CM | POA: Diagnosis not present

## 2022-12-31 DIAGNOSIS — F17201 Nicotine dependence, unspecified, in remission: Secondary | ICD-10-CM | POA: Diagnosis not present

## 2022-12-31 DIAGNOSIS — N1831 Chronic kidney disease, stage 3a: Secondary | ICD-10-CM | POA: Diagnosis not present

## 2022-12-31 DIAGNOSIS — I129 Hypertensive chronic kidney disease with stage 1 through stage 4 chronic kidney disease, or unspecified chronic kidney disease: Secondary | ICD-10-CM | POA: Diagnosis not present

## 2022-12-31 NOTE — Telephone Encounter (Signed)
-----   Message from Yetta Flock, MD sent at 12/31/2022 12:09 PM EST ----- Regarding: office follow up Dr. Brigitte Pulse reached out to me to see if we can coordinate an office follow up for this patient for anemia and gastric nodule. Can you help with this? Thanks!

## 2023-01-01 NOTE — Telephone Encounter (Signed)
Incoming call from patient returning your call, states she will be out of town until 5/5. Informed her I'm only able to see the schedule through 5/3, stated she will check back with [place] in a week or 2.

## 2023-01-01 NOTE — Telephone Encounter (Signed)
Noted, thank you

## 2023-01-01 NOTE — Telephone Encounter (Signed)
Left patient a detailed vm letting her know that Dr. Brigitte Pulse reached out to Dr. Havery Moros about getting her scheduled for a follow up appt. I asked that patient call back at her earliest convenience to schedule.

## 2023-01-02 ENCOUNTER — Other Ambulatory Visit: Payer: Self-pay | Admitting: Internal Medicine

## 2023-01-02 DIAGNOSIS — Z0000 Encounter for general adult medical examination without abnormal findings: Secondary | ICD-10-CM

## 2023-01-21 DIAGNOSIS — Z1231 Encounter for screening mammogram for malignant neoplasm of breast: Secondary | ICD-10-CM | POA: Diagnosis not present

## 2023-01-21 DIAGNOSIS — M8589 Other specified disorders of bone density and structure, multiple sites: Secondary | ICD-10-CM | POA: Diagnosis not present

## 2023-01-22 ENCOUNTER — Other Ambulatory Visit (HOSPITAL_COMMUNITY): Payer: Self-pay | Admitting: Internal Medicine

## 2023-01-22 DIAGNOSIS — R011 Cardiac murmur, unspecified: Secondary | ICD-10-CM

## 2023-01-31 ENCOUNTER — Telehealth: Payer: Self-pay | Admitting: Gastroenterology

## 2023-01-31 NOTE — Telephone Encounter (Signed)
Labs arrived from Dr. Raul Del office:  Hemeoccult stools 01/16/23 - positive x 2  12/24/22: WBC 6.39, Hgb 10.9, MCV 83.3, plt 255 Ferritin 229 Iron 63, TIBC 323, iron sat 20%   [place] can you help coordinate office follow up for this patient? I think she was going to call back to schedule with [place] from reviewing prior notes but I don't think she did so. Thanks

## 2023-01-31 NOTE — Telephone Encounter (Signed)
Patient already has a f/u scheduled on Friday, 03/28/23 at 9 am

## 2023-03-19 ENCOUNTER — Ambulatory Visit: Payer: Medicare Other

## 2023-03-28 ENCOUNTER — Ambulatory Visit: Payer: Medicare Other | Admitting: Gastroenterology

## 2023-08-04 DIAGNOSIS — Z23 Encounter for immunization: Secondary | ICD-10-CM | POA: Diagnosis not present

## 2023-10-06 DIAGNOSIS — H524 Presbyopia: Secondary | ICD-10-CM | POA: Diagnosis not present

## 2023-10-06 DIAGNOSIS — H52223 Regular astigmatism, bilateral: Secondary | ICD-10-CM | POA: Diagnosis not present

## 2023-10-06 DIAGNOSIS — H5203 Hypermetropia, bilateral: Secondary | ICD-10-CM | POA: Diagnosis not present

## 2023-10-06 DIAGNOSIS — Z961 Presence of intraocular lens: Secondary | ICD-10-CM | POA: Diagnosis not present

## 2024-01-22 DIAGNOSIS — Z1231 Encounter for screening mammogram for malignant neoplasm of breast: Secondary | ICD-10-CM | POA: Diagnosis not present

## 2024-05-19 ENCOUNTER — Other Ambulatory Visit: Payer: Self-pay | Admitting: Internal Medicine

## 2024-05-19 DIAGNOSIS — G3184 Mild cognitive impairment, so stated: Secondary | ICD-10-CM | POA: Diagnosis not present

## 2024-05-19 DIAGNOSIS — E785 Hyperlipidemia, unspecified: Secondary | ICD-10-CM | POA: Diagnosis not present

## 2024-05-19 DIAGNOSIS — I10 Essential (primary) hypertension: Secondary | ICD-10-CM | POA: Diagnosis not present

## 2024-05-19 DIAGNOSIS — R7301 Impaired fasting glucose: Secondary | ICD-10-CM | POA: Diagnosis not present

## 2024-05-27 ENCOUNTER — Other Ambulatory Visit: Payer: Self-pay

## 2024-05-27 ENCOUNTER — Encounter (HOSPITAL_COMMUNITY): Payer: Self-pay

## 2024-05-27 ENCOUNTER — Emergency Department (HOSPITAL_COMMUNITY)

## 2024-05-27 ENCOUNTER — Emergency Department (HOSPITAL_COMMUNITY)
Admission: EM | Admit: 2024-05-27 | Discharge: 2024-05-27 | Disposition: A | Discharge status: Home / Self Care | Attending: Emergency Medicine | Admitting: Emergency Medicine

## 2024-05-27 DIAGNOSIS — W19XXXA Unspecified fall, initial encounter: Secondary | ICD-10-CM | POA: Diagnosis not present

## 2024-05-27 DIAGNOSIS — R58 Hemorrhage, not elsewhere classified: Secondary | ICD-10-CM | POA: Diagnosis not present

## 2024-05-27 DIAGNOSIS — S4991XA Unspecified injury of right shoulder and upper arm, initial encounter: Secondary | ICD-10-CM | POA: Diagnosis present

## 2024-05-27 DIAGNOSIS — S42121A Displaced fracture of acromial process, right shoulder, initial encounter for closed fracture: Secondary | ICD-10-CM | POA: Insufficient documentation

## 2024-05-27 DIAGNOSIS — W01198A Fall on same level from slipping, tripping and stumbling with subsequent striking against other object, initial encounter: Secondary | ICD-10-CM | POA: Insufficient documentation

## 2024-05-27 DIAGNOSIS — S4981XA Other specified injuries of right shoulder and upper arm, initial encounter: Secondary | ICD-10-CM | POA: Diagnosis not present

## 2024-05-27 DIAGNOSIS — Z043 Encounter for examination and observation following other accident: Secondary | ICD-10-CM | POA: Diagnosis not present

## 2024-05-27 DIAGNOSIS — S42031A Displaced fracture of lateral end of right clavicle, initial encounter for closed fracture: Secondary | ICD-10-CM | POA: Diagnosis not present

## 2024-05-27 DIAGNOSIS — I10 Essential (primary) hypertension: Secondary | ICD-10-CM | POA: Diagnosis not present

## 2024-05-27 DIAGNOSIS — M25511 Pain in right shoulder: Secondary | ICD-10-CM | POA: Diagnosis not present

## 2024-05-27 NOTE — ED Provider Notes (Signed)
 [place] EMERGENCY DEPARTMENT AT [facility] Provider Note   CSN: [id number] Arrival date & time: 05/27/24  1444     Patient presents with: [name]   [name] is a 83 y.o. female.   HPI Patient presents after a fall.  She notes that she had mechanical fall, struck her right shoulder, since that time has had severe pain in the proximal right arm, worse with any attempted motion.  No head trauma, no head pain, neck pain, weakness in any extremity.  Patient is generally well, with so prior to the event.    Prior to Admission medications   Medication Sig Start Date End Date Taking? Authorizing Provider  alendronate (FOSAMAX) 70 MG tablet Take 70 mg by mouth once a week. Take with a full glass of water on an empty stomach.    [provider]  atorvastatin (LIPITOR) 10 MG tablet Take 10 mg by mouth daily.    [provider]  CALCIUM CITRATE PO Take 1 tablet by mouth daily.    [provider]  Cholecalciferol (VITAMIN D3) 50 MCG (2000 UT) TABS Take 1 tablet by mouth daily.    [provider]  Coenzyme Q10 (COQ10) 100 MG CAPS Take 1 capsule by mouth daily.    [provider]  Cyanocobalamin  (B-12) 2500 MCG TABS Take 1 tablet by mouth daily.    [provider]  diclofenac Sodium (VOLTAREN) 1 % GEL Apply 1 application topically as needed.    [provider]  docusate sodium (COLACE) 50 MG capsule Take 50 mg by mouth daily.    [provider]  ferrous sulfate 325 (65 FE) MG EC tablet Take 1 tablet (325 mg total) by mouth 2 (two) times daily. Take three times a day if tolerated 03/06/20   Armbruster, Elspeth SQUIBB, MD  lisinopril-hydrochlorothiazide (ZESTORETIC) 20-25 MG tablet Take 1 tablet by mouth daily.    [provider]  loratadine (CLARITIN) 10 MG tablet Take 10 mg by mouth daily as needed for allergies.    [provider]  Magnesium 250 MG TABS Take 1 tablet by mouth daily.    [provider]  Multiple Vitamins-Minerals (ICAPS AREDS 2 PO) Take 1 capsule by mouth daily.    [provider]  zolpidem  (AMBIEN ) 10 MG tablet Take 10 mg by mouth at bedtime as needed for sleep.    [provider]    Allergies: Penicillins    Review of Systems  Updated Vital Signs BP (!) 106/94 (BP Location: Right Arm)   Pulse 86   Temp 97.8 F (36.6 C) (Oral)   Resp 16   SpO2 95%   Physical Exam Vitals and nursing note reviewed.  Constitutional:      General: She is not in acute distress.    Appearance: She is well-developed.  HENT:     Head: Normocephalic and atraumatic.  Eyes:     Conjunctiva/sclera: Conjunctivae normal.  Cardiovascular:     Rate and Rhythm: Normal rate and regular rhythm.     Pulses: Normal pulses.  Pulmonary:     Effort: Pulmonary effort is normal. No respiratory distress.     Breath sounds: No stridor.  Abdominal:     General: There is no distension.  Musculoskeletal:     Right elbow: Normal.     Left elbow: Normal.       Arms:  Skin:    General: Skin is warm and dry.  Neurological:     Mental  Status: She is alert and oriented to person, place, and time.     Cranial Nerves: No cranial nerve deficit.  Psychiatric:        Mood and Affect: Mood normal.     (all labs ordered are listed, but only abnormal results are displayed) Labs Reviewed - No data to display  EKG: None  Radiology: DG Elbow 2 Views Right Result Date: 05/27/2024 CLINICAL DATA:  Fall and trauma to the right elbow. EXAM: RIGHT ELBOW - 2 VIEW COMPARISON:  None Available. FINDINGS: No acute fracture or dislocation. No significant arthritic changes. No joint effusion. The soft tissues are unremarkable. IMPRESSION: Negative. Electronically Signed   By: Vanetta Chou M.D.   On: 05/27/2024 17:49   CT Shoulder Right Wo Contrast Result Date: 05/27/2024 CLINICAL DATA:  Right shoulder pain after fall. EXAM: CT OF THE UPPER RIGHT EXTREMITY WITHOUT CONTRAST  TECHNIQUE: Multidetector CT imaging of the upper right extremity was performed according to the standard protocol. RADIATION DOSE REDUCTION: This exam was performed according to the departmental dose-optimization program which includes automated exposure control, adjustment of the mA and/or kV according to patient size and/or use of iterative reconstruction technique. COMPARISON:  None Available. FINDINGS: Mildly displaced and comminuted fracture is seen involving the acromion which may be acute or subacute. No other fracture or dislocation is noted. Visualized clavicle is unremarkable. Visualized ribs are unremarkable. Narrowing of subacromial space is noted suggesting chronic rotator cuff injury. Glenohumeral joint is unremarkable. IMPRESSION: Mildly displaced and comminuted acute to subacute fracture is seen involving the right acromion. Electronically Signed   By: Lynwood Landy Raddle M.D.   On: 05/27/2024 17:37   DG Shoulder Right Result Date: 05/27/2024 CLINICAL DATA:  Fall. EXAM: RIGHT SHOULDER - 2+ VIEW COMPARISON:  None Available. FINDINGS: No acute fracture or dislocation. No aggressive osseous lesion. Glenohumeral and acromioclavicular joints are normal in alignment and exhibit mild-to-moderate degenerative changes. No soft tissue swelling. No radiopaque foreign bodies. IMPRESSION: *No acute osseous abnormality of the right shoulder joint. Electronically Signed   By: Ree Molt M.D.   On: 05/27/2024 15:44     Procedures   Medications Ordered in the ED - No data to display                                  Medical Decision Making Adult female presents with shoulder, proximal arm pain following a fall.  Patient is hemodynamically unremarkable, awake, alert, has no distal neurovascular compromise no head pain, neck pain, concern for soft tissue versus fracture.  Initial x-ray without evidence for fracture, given concern for substantial pain, age, core avidity CT ordered.  Amount and/or  Complexity of Data Reviewed Radiology: ordered and independent interpretation performed. Decision-making details documented in ED Course.  Risk Decision regarding hospitalization. Diagnosis or treatment significantly limited by social determinants of health.  Patient declines pain medicine 7:04 PM CT with evidence for acromion fracture I discussed the patient's case with Dr. Burnetta.  Patient has had sling mobilization applied by myself and nurse.  Patient appropriate for discharge with outpatient orthopedics follow-up.     Final diagnoses:  Fall, initial encounter  Closed displaced fracture of acromial process of right scapula, initial encounter    ED Discharge Orders     None          Garrick Charleston, MD 05/27/24 1904

## 2024-05-27 NOTE — ED Triage Notes (Signed)
 Patient presented from home via [facility]. Patient sustained mechanical fall and landed on R shoulder. Patient denies hitting head, denies LOC, denies taking blood thinners. Patient R arm pain noted. Discoloration of R shoulder-patient states this is from her the iron shot I got yesterday.

## 2024-05-27 NOTE — Discharge Instructions (Signed)
 You have been diagnosed with a fracture of the acromion, which is part of the shoulder.  Typically this improves with sling immobilization, ice packs and Tylenol.  To facilitate appropriate healing, please be sure to follow-up with one of our 2 orthopedic surgeons listed above.  Follow-up should occur in about 1 week.  Return here for concerning changes in your condition.

## 2024-05-31 DIAGNOSIS — M25512 Pain in left shoulder: Secondary | ICD-10-CM | POA: Diagnosis not present

## 2024-06-01 ENCOUNTER — Encounter: Payer: Self-pay | Admitting: Physician Assistant

## 2024-06-01 DIAGNOSIS — S42121D Displaced fracture of acromial process, right shoulder, subsequent encounter for fracture with routine healing: Secondary | ICD-10-CM | POA: Diagnosis not present

## 2024-06-01 DIAGNOSIS — S51001D Unspecified open wound of right elbow, subsequent encounter: Secondary | ICD-10-CM | POA: Diagnosis not present

## 2024-06-02 ENCOUNTER — Other Ambulatory Visit

## 2024-06-05 DIAGNOSIS — S51001D Unspecified open wound of right elbow, subsequent encounter: Secondary | ICD-10-CM | POA: Diagnosis not present

## 2024-06-05 DIAGNOSIS — S42121D Displaced fracture of acromial process, right shoulder, subsequent encounter for fracture with routine healing: Secondary | ICD-10-CM | POA: Diagnosis not present

## 2024-06-08 DIAGNOSIS — S51001D Unspecified open wound of right elbow, subsequent encounter: Secondary | ICD-10-CM | POA: Diagnosis not present

## 2024-06-08 DIAGNOSIS — S42121D Displaced fracture of acromial process, right shoulder, subsequent encounter for fracture with routine healing: Secondary | ICD-10-CM | POA: Diagnosis not present

## 2024-06-09 ENCOUNTER — Telehealth: Payer: Self-pay | Admitting: Pharmacy Technician

## 2024-06-09 DIAGNOSIS — M810 Age-related osteoporosis without current pathological fracture: Secondary | ICD-10-CM | POA: Diagnosis not present

## 2024-06-09 DIAGNOSIS — N1831 Chronic kidney disease, stage 3a: Secondary | ICD-10-CM | POA: Diagnosis not present

## 2024-06-09 DIAGNOSIS — I129 Hypertensive chronic kidney disease with stage 1 through stage 4 chronic kidney disease, or unspecified chronic kidney disease: Secondary | ICD-10-CM | POA: Diagnosis not present

## 2024-06-09 NOTE — Telephone Encounter (Signed)
 Auth Submission: NO AUTH NEEDED Site of care: Site of care: [facility] Payer: medicare a/b & bcbs supp Medication & CPT/J Code(s) submitted: Evenity (Romosozumab) [id number] Diagnosis Code:  Route of submission (phone, fax, portal):  Phone # Fax # Auth type: Buy/Bill HB Units/visits requested: 210mg  monthly x12 months Reference number:  Approval from: 06/09/24 to 06/09/25

## 2024-06-10 DIAGNOSIS — S42121D Displaced fracture of acromial process, right shoulder, subsequent encounter for fracture with routine healing: Secondary | ICD-10-CM | POA: Diagnosis not present

## 2024-06-10 DIAGNOSIS — S51001D Unspecified open wound of right elbow, subsequent encounter: Secondary | ICD-10-CM | POA: Diagnosis not present

## 2024-06-15 DIAGNOSIS — M19042 Primary osteoarthritis, left hand: Secondary | ICD-10-CM | POA: Diagnosis not present

## 2024-06-15 DIAGNOSIS — N183 Chronic kidney disease, stage 3 unspecified: Secondary | ICD-10-CM | POA: Diagnosis not present

## 2024-06-15 DIAGNOSIS — E785 Hyperlipidemia, unspecified: Secondary | ICD-10-CM | POA: Diagnosis not present

## 2024-06-15 DIAGNOSIS — M8588 Other specified disorders of bone density and structure, other site: Secondary | ICD-10-CM | POA: Diagnosis not present

## 2024-06-15 DIAGNOSIS — S42121D Displaced fracture of acromial process, right shoulder, subsequent encounter for fracture with routine healing: Secondary | ICD-10-CM | POA: Diagnosis not present

## 2024-06-15 DIAGNOSIS — D631 Anemia in chronic kidney disease: Secondary | ICD-10-CM | POA: Diagnosis not present

## 2024-06-15 DIAGNOSIS — S51001D Unspecified open wound of right elbow, subsequent encounter: Secondary | ICD-10-CM | POA: Diagnosis not present

## 2024-06-15 DIAGNOSIS — M19041 Primary osteoarthritis, right hand: Secondary | ICD-10-CM | POA: Diagnosis not present

## 2024-06-15 DIAGNOSIS — G3184 Mild cognitive impairment, so stated: Secondary | ICD-10-CM | POA: Diagnosis not present

## 2024-06-15 DIAGNOSIS — I129 Hypertensive chronic kidney disease with stage 1 through stage 4 chronic kidney disease, or unspecified chronic kidney disease: Secondary | ICD-10-CM | POA: Diagnosis not present

## 2024-06-15 DIAGNOSIS — D509 Iron deficiency anemia, unspecified: Secondary | ICD-10-CM | POA: Diagnosis not present

## 2024-06-15 DIAGNOSIS — R7301 Impaired fasting glucose: Secondary | ICD-10-CM | POA: Diagnosis not present

## 2024-06-18 DIAGNOSIS — S42121D Displaced fracture of acromial process, right shoulder, subsequent encounter for fracture with routine healing: Secondary | ICD-10-CM | POA: Diagnosis not present

## 2024-06-18 DIAGNOSIS — S51001D Unspecified open wound of right elbow, subsequent encounter: Secondary | ICD-10-CM | POA: Diagnosis not present

## 2024-06-23 DIAGNOSIS — S42121D Displaced fracture of acromial process, right shoulder, subsequent encounter for fracture with routine healing: Secondary | ICD-10-CM | POA: Diagnosis not present

## 2024-06-23 DIAGNOSIS — S51001D Unspecified open wound of right elbow, subsequent encounter: Secondary | ICD-10-CM | POA: Diagnosis not present

## 2024-06-30 DIAGNOSIS — S42121D Displaced fracture of acromial process, right shoulder, subsequent encounter for fracture with routine healing: Secondary | ICD-10-CM | POA: Diagnosis not present

## 2024-06-30 DIAGNOSIS — S51001D Unspecified open wound of right elbow, subsequent encounter: Secondary | ICD-10-CM | POA: Diagnosis not present

## 2024-07-01 DIAGNOSIS — S51001D Unspecified open wound of right elbow, subsequent encounter: Secondary | ICD-10-CM | POA: Diagnosis not present

## 2024-07-01 DIAGNOSIS — S42121D Displaced fracture of acromial process, right shoulder, subsequent encounter for fracture with routine healing: Secondary | ICD-10-CM | POA: Diagnosis not present

## 2024-07-08 DIAGNOSIS — S42121D Displaced fracture of acromial process, right shoulder, subsequent encounter for fracture with routine healing: Secondary | ICD-10-CM | POA: Diagnosis not present

## 2024-07-08 DIAGNOSIS — S51001D Unspecified open wound of right elbow, subsequent encounter: Secondary | ICD-10-CM | POA: Diagnosis not present

## 2024-07-13 DIAGNOSIS — S42121D Displaced fracture of acromial process, right shoulder, subsequent encounter for fracture with routine healing: Secondary | ICD-10-CM | POA: Diagnosis not present

## 2024-07-13 DIAGNOSIS — S51001D Unspecified open wound of right elbow, subsequent encounter: Secondary | ICD-10-CM | POA: Diagnosis not present

## 2024-07-15 DIAGNOSIS — S51001D Unspecified open wound of right elbow, subsequent encounter: Secondary | ICD-10-CM | POA: Diagnosis not present

## 2024-07-15 DIAGNOSIS — S42121D Displaced fracture of acromial process, right shoulder, subsequent encounter for fracture with routine healing: Secondary | ICD-10-CM | POA: Diagnosis not present

## 2024-07-20 DIAGNOSIS — S42121D Displaced fracture of acromial process, right shoulder, subsequent encounter for fracture with routine healing: Secondary | ICD-10-CM | POA: Diagnosis not present

## 2024-07-20 DIAGNOSIS — S51001D Unspecified open wound of right elbow, subsequent encounter: Secondary | ICD-10-CM | POA: Diagnosis not present

## 2024-07-27 DIAGNOSIS — M810 Age-related osteoporosis without current pathological fracture: Secondary | ICD-10-CM | POA: Insufficient documentation

## 2024-07-27 DIAGNOSIS — S42121D Displaced fracture of acromial process, right shoulder, subsequent encounter for fracture with routine healing: Secondary | ICD-10-CM | POA: Diagnosis not present

## 2024-07-27 DIAGNOSIS — S51001D Unspecified open wound of right elbow, subsequent encounter: Secondary | ICD-10-CM | POA: Diagnosis not present

## 2024-07-27 HISTORY — DX: Age-related osteoporosis without current pathological fracture: M81.0

## 2024-08-02 DIAGNOSIS — Z23 Encounter for immunization: Secondary | ICD-10-CM | POA: Diagnosis not present

## 2024-08-03 ENCOUNTER — Ambulatory Visit (HOSPITAL_COMMUNITY)
Admission: RE | Admit: 2024-08-03 | Discharge: 2024-08-03 | Disposition: A | Discharge status: Home / Self Care | Source: Ambulatory Visit | Attending: Internal Medicine | Admitting: Internal Medicine

## 2024-08-03 VITALS — BP 140/65 | HR 97 | Temp 97.5°F | Resp 17

## 2024-08-03 DIAGNOSIS — M810 Age-related osteoporosis without current pathological fracture: Secondary | ICD-10-CM | POA: Diagnosis not present

## 2024-08-03 MED ORDER — ROMOSOZUMAB-AQQG 105 MG/1.17ML ~~LOC~~ SOSY
210.0000 mg | PREFILLED_SYRINGE | Freq: Once | SUBCUTANEOUS | Status: AC
  Administered 2024-08-03: 210 mg via SUBCUTANEOUS

## 2024-08-03 MED ORDER — ROMOSOZUMAB-AQQG 105 MG/1.17ML ~~LOC~~ SOSY
PREFILLED_SYRINGE | SUBCUTANEOUS | Status: AC
  Filled 2024-08-03: qty 1.17

## 2024-08-10 DIAGNOSIS — Z1389 Encounter for screening for other disorder: Secondary | ICD-10-CM | POA: Diagnosis not present

## 2024-08-10 DIAGNOSIS — E7849 Other hyperlipidemia: Secondary | ICD-10-CM | POA: Diagnosis not present

## 2024-08-10 DIAGNOSIS — D509 Iron deficiency anemia, unspecified: Secondary | ICD-10-CM | POA: Diagnosis not present

## 2024-08-10 DIAGNOSIS — M810 Age-related osteoporosis without current pathological fracture: Secondary | ICD-10-CM | POA: Diagnosis not present

## 2024-08-11 NOTE — Progress Notes (Signed)
 Assessment/Plan:     [name] is a very pleasant 83 y.o. year old RH female with a history of hypertension, hyperlipidemia, iron deficiency anemia seen today for evaluation of memory loss. MoCA today is 26/30. Etiology is unclear, workup is in progress.  Patient is able to participate on ADLs, mood is good   Memory concerns of unclear etiology   MRI brain with and without contrast has been scheduled by her PCP,  to assess for underlying structural abnormality and assess vascular load has been ordered by PCP. Neurocognitive testing to further evaluate cognitive concerns and determine other underlying cause of memory changes, including potential contribution from sleep, anxiety, attention, or depression among others  Check labs as per PCP per patient report  B12, TSH, CBC, Iron studies) Recommend good control of cardiovascular risk factors.   Continue to control mood as per PCP Recommend decreasing Ambien  (10 mg) as it can contribute to memory changes  Folllow up pending the neuropsych evaluation and MRI results   Subjective:    The patient is accompanied by her daughter who supplements  the history.    How long did patient have memory difficulties? Dr. Loreli made the recommendation as a baseline.  She had a fall in July 2025 at which time she could not provide great information as she normally would so there was a concern about memory. I do a little backup for myself, I write myself a note. Likes watching TV, book and [employer], stays active.  repeats oneself?  Endorsed by her daughter.  She doesDouble checking.  A few of her friends reached to her daughter due to this issue Disoriented when walking into a room? Denies    Leaving objects in unusual places?  Denies.   Wandering behavior? Denies.   Any personality changes, or depression, anxiety?  She does everything for her husband, from taking care of the bills, medications and meals, appointments, etc., which may be  exhausting at times.   Hallucinations or paranoia? Denies.   Seizures? Denies.    Any sleep changes?  Sleeps well, denies frequent nightmares or dream reenactment, other REM behavior or sleepwalking   Sleep apnea? Denies.   Any hygiene concerns?  Denies.   Independent of bathing and dressing? Endorsed  Does the patient need help with medications?  Patient is in charge    Who is in charge of the finances? Patient  is in charge     Any changes in appetite?   Denies.      Patient have trouble swallowing?  Denies.   Does the patient cook?  yes, denies forgetting common recipes or kitchen accidents   Any headaches?  Denies.   Chronic pain? Denies.   Ambulates with difficulty? Denies. Does not walk frequently. Recent falls or head injuries? Mechanical fall maybe it was the shoes and the floor,  05/2024 hitting her R arm, no LOC or head injury     Vision changes?  Denies any new issues.    Any strokelike symptoms? Denies.   Any tremors? Denies.  Any anosmia? Denies.   Any incontinence of urine? Denies.   Any bowel dysfunction? Denies.      Patient lives with her husband   History of heavy alcohol  intake? Denies.   History of heavy tobacco use? Denies.   Coffee 2 cups a day and tea throughout he day  Family history of dementia?  Denies Does patient drive? yes, denies getting lost.   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  Masters of arts in teaching    Allergies  Allergen Reactions   Penicillins Rash    long time ago    Current Outpatient Medications  Medication Instructions   alendronate (FOSAMAX) 70 mg, Oral, Weekly, Take with a full glass of water on an empty stomach.    atorvastatin (LIPITOR) 10 mg, Daily   CALCIUM CITRATE PO 1 tablet, Daily   Cholecalciferol (VITAMIN D3) 50 MCG (2000 UT) TABS 1 tablet, Daily   Coenzyme Q10 (COQ10) 100 MG CAPS 1 capsule, Daily   Cyanocobalamin  (B-12) 2500 MCG TABS 1 tablet, Daily   diclofenac Sodium (VOLTAREN) 1 % GEL 1 application , As  needed   docusate sodium (COLACE) 50 mg, Daily   ferrous sulfate 325 mg, 2 times daily   lisinopril-hydrochlorothiazide (ZESTORETIC) 20-25 MG tablet 1 tablet, Daily   loratadine (CLARITIN) 10 mg, Daily PRN   Magnesium 250 MG TABS 1 tablet, Daily   Multiple Vitamins-Minerals (ICAPS AREDS 2 PO) 1 capsule, Daily   zolpidem  (AMBIEN ) 10 mg, At bedtime PRN     VITALS:   Vitals:   08/12/24 1004  BP: (!) 161/59  Pulse: 83  Resp: 20  SpO2: 96%  Weight: 132 lb (59.9 kg)     Physical Exam  :    08/12/2024   12:00 PM  Montreal Cognitive Assessment   Visuospatial/ Executive (0/5) 4  Naming (0/3) 3  Attention: Read list of digits (0/2) 2  Attention: Read list of letters (0/1) 1  Attention: Serial 7 subtraction starting at 100 (0/3) 3  Language: Repeat phrase (0/2) 2  Language : Fluency (0/1) 1  Abstraction (0/2) 2  Delayed Recall (0/5) 3  Orientation (0/6) 5  Total 26  Adjusted Score (based on education) 26        No data to display             HEENT:  Normocephalic, atraumatic.  The superficial temporal arteries are without ropiness or tenderness. Cardiovascular: Regular rate and rhythm. Lungs: Clear to auscultation bilaterally. Neck: There are no carotid bruits noted bilaterally. Orientation:  Alert and oriented to person, place and to date, not the day of the week . No aphasia or dysarthria. Fund of knowledge is appropriate. Recent memory impaired, remote memory normal.  Attention and concentration are normal.  Able to name objects and repeat phrases.  Delayed recall 3 /5 .  Cranial nerves: There is good facial symmetry. Extraocular muscles are intact and visual fields are full to confrontational testing. Speech is fluent and clear. No tongue deviation. Hearing is intact to conversational tone.  Tone: Tone is good throughout. Sensation: Sensation is intact to light touch.  Vibration is intact at the bilateral big toe.  Coordination: The patient has no difficulty with  RAM's or FNF bilaterally. Normal finger to nose  Motor: Strength is 5/5 in the bilateral upper and lower extremities. There is no pronator drift. There are no fasciculations noted. DTR's: Deep tendon reflexes are 2/4 bilaterally. Gait and Station: The patient is able to ambulate without difficulty. Gait is cautious and mildly broad-based. Stride length is normal.        Thank you for allowing us  the opportunity to participate in the care of this nice patient. Please do not hesitate to contact us  for any questions or concerns.   Total time spent on today's visit was 45 minutes dedicated to this patient today, preparing to see patient, examining the patient, ordering tests and/or medications and counseling the patient,  documenting clinical information in the EHR or other health record, independently interpreting results and communicating results to the patient/family, discussing treatment and goals, answering patient's questions and coordinating care.  Cc:  Loreli Elsie JONETTA Mickey., MD  Camie Sevin 08/12/2024 12:32 PM

## 2024-08-12 ENCOUNTER — Encounter: Payer: Self-pay | Admitting: Physician Assistant

## 2024-08-12 ENCOUNTER — Ambulatory Visit (INDEPENDENT_AMBULATORY_CARE_PROVIDER_SITE_OTHER): Admitting: Physician Assistant

## 2024-08-12 ENCOUNTER — Ambulatory Visit: Admitting: Physician Assistant

## 2024-08-12 ENCOUNTER — Ambulatory Visit

## 2024-08-12 VITALS — BP 161/59 | HR 83 | Resp 20 | Wt 132.0 lb

## 2024-08-12 DIAGNOSIS — R413 Other amnesia: Secondary | ICD-10-CM | POA: Diagnosis not present

## 2024-08-12 NOTE — Patient Instructions (Signed)
 It was a pleasure to see you today at our office.   Recommendations:  Neurocognitive evaluation at our office  MRI of the brain by pcp Check labs PCP Follow up pending on the results of the neurocognitive testing      [email or web address]   RECOMMENDATIONS FOR ALL PATIENTS WITH MEMORY PROBLEMS: 1. Continue to exercise (Recommend 30 minutes of walking everyday, or 3 hours every week) 2. Increase social interactions - continue going to [place] and enjoy social gatherings with friends and family 3. Eat healthy, avoid fried foods and eat more fruits and vegetables 4. Maintain adequate blood pressure, blood sugar, and blood cholesterol level. Reducing the risk of stroke and cardiovascular disease also helps promoting better memory. 5. Avoid stressful situations. Live a simple life and avoid aggravations. Organize your time and prepare for the next day in anticipation. 6. Sleep well, avoid any interruptions of sleep and avoid any distractions in the bedroom that may interfere with adequate sleep quality 7. Avoid sugar, avoid sweets as there is a strong link between excessive sugar intake, diabetes, and cognitive impairment We discussed the Mediterranean diet, which has been shown to help patients reduce the risk of progressive memory disorders and reduces cardiovascular risk. This includes eating fish, eat fruits and green leafy vegetables, nuts like almonds and hazelnuts, walnuts, and also use olive oil. Avoid fast foods and fried foods as much as possible. Avoid sweets and sugar as sugar use has been linked to worsening of memory function.  There is always a concern of gradual progression of memory problems. If this is the case, then we may need to adjust level of care according to patient needs. Support, both to the patient and caregiver, should then be put into place.      You have been referred for a neuropsychological evaluation (i.e.,  evaluation of memory and thinking abilities). Please bring someone with you to this appointment if possible, as it is helpful for the doctor to hear from both you and another adult who knows you well. Please bring eyeglasses and hearing aids if you wear them.    The evaluation will take approximately 3 hours and has two parts:   The first part is a clinical interview with the neuropsychologist (Dr. Richie or Dr. Gayland). During the interview, the neuropsychologist will speak with you and the individual you brought to the appointment.    The second part of the evaluation is testing with the doctor's technician Neal or Luke). During the testing, the technician will ask you to remember different types of material, solve problems, and answer some questionnaires. Your family member will not be present for this portion of the evaluation.   Please note: We must reserve several hours of the neuropsychologist's time and the psychometrician's time for your evaluation appointment. As such, there is a No-Show fee of $100. If you are unable to attend any of your appointments, please contact our office as soon as possible to reschedule.      DRIVING: Regarding driving, in patients with progressive memory problems, driving will be impaired. We advise to have someone else do the driving if trouble finding directions or if minor accidents are reported. Independent driving assessment is available to determine safety of driving.   If you are interested in the driving assessment, you can contact the following:  [employer] in [place] [phone]  [facility] [phone]  [facility] [phone]  [facility] [phone] or [phone]   FALL PRECAUTIONS: Be cautious when  walking. Scan the area for obstacles that may increase the risk of trips and falls. When getting up in the mornings, sit up at the edge of the bed for a few minutes before getting out of  bed. Consider elevating the bed at the head end to avoid drop of blood pressure when getting up. Walk always in a well-lit room (use night lights in the walls). Avoid area rugs or power cords from appliances in the middle of the walkways. Use a walker or a cane if necessary and consider physical therapy for balance exercise. Get your eyesight checked regularly.  FINANCIAL OVERSIGHT: Supervision, especially oversight when making financial decisions or transactions is also recommended.  HOME SAFETY: Consider the safety of the kitchen when operating appliances like stoves, microwave oven, and blender. Consider having supervision and share cooking responsibilities until no longer able to participate in those. Accidents with firearms and other hazards in the house should be identified and addressed as well.   ABILITY TO BE LEFT ALONE: If patient is unable to contact 911 operator, consider using LifeLine, or when the need is there, arrange for someone to stay with patients. Smoking is a fire hazard, consider supervision or cessation. Risk of wandering should be assessed by caregiver and if detected at any point, supervision and safe proof recommendations should be instituted.  MEDICATION SUPERVISION: Inability to self-administer medication needs to be constantly addressed. Implement a mechanism to ensure safe administration of the medications.      Mediterranean Diet A Mediterranean diet refers to food and lifestyle choices that are based on the traditions of countries located on the [place]. This way of eating has been shown to help prevent certain conditions and improve outcomes for people who have chronic diseases, like kidney disease and heart disease. What are tips for following this plan? Lifestyle  Cook and eat meals together with your family, when possible. Drink enough fluid to keep your urine clear or pale yellow. Be physically active every day. This includes: Aerobic exercise  like running or swimming. Leisure activities like gardening, walking, or housework. Get 7-8 hours of sleep each night. If recommended by your health care provider, drink red wine in moderation. This means 1 glass a day for nonpregnant women and 2 glasses a day for men. A glass of wine equals 5 oz (150 mL). Reading food labels  Check the serving size of packaged foods. For foods such as rice and pasta, the serving size refers to the amount of cooked product, not dry. Check the total fat in packaged foods. Avoid foods that have saturated fat or trans fats. Check the ingredients list for added sugars, such as corn syrup. Shopping  At the grocery store, buy most of your food from the areas near the walls of the store. This includes: Fresh fruits and vegetables (produce). Grains, beans, nuts, and seeds. Some of these may be available in unpackaged forms or large amounts (in bulk). Fresh seafood. Poultry and eggs. Low-fat dairy products. Buy whole ingredients instead of prepackaged foods. Buy fresh fruits and vegetables in-season from local farmers markets. Buy frozen fruits and vegetables in resealable bags. If you do not have access to quality fresh seafood, buy precooked frozen shrimp or canned fish, such as tuna, salmon, or sardines. Buy small amounts of raw or cooked vegetables, salads, or olives from the deli or salad bar at your store. Stock your pantry so you always have certain foods on hand, such as olive oil, canned tuna, canned tomatoes, rice,  pasta, and beans. Cooking  Cook foods with extra-virgin olive oil instead of using butter or other vegetable oils. Have meat as a side dish, and have vegetables or grains as your main dish. This means having meat in small portions or adding small amounts of meat to foods like pasta or stew. Use beans or vegetables instead of meat in common dishes like chili or lasagna. Experiment with different cooking methods. Try roasting or broiling vegetables  instead of steaming or sauteing them. Add frozen vegetables to soups, stews, pasta, or rice. Add nuts or seeds for added healthy fat at each meal. You can add these to yogurt, salads, or vegetable dishes. Marinate fish or vegetables using olive oil, lemon juice, garlic, and fresh herbs. Meal planning  Plan to eat 1 vegetarian meal one day each week. Try to work up to 2 vegetarian meals, if possible. Eat seafood 2 or more times a week. Have healthy snacks readily available, such as: Vegetable sticks with hummus. Greek yogurt. Fruit and nut trail mix. Eat balanced meals throughout the week. This includes: Fruit: 2-3 servings a day Vegetables: 4-5 servings a day Low-fat dairy: 2 servings a day Fish, poultry, or lean meat: 1 serving a day Beans and legumes: 2 or more servings a week Nuts and seeds: 1-2 servings a day Whole grains: 6-8 servings a day Extra-virgin olive oil: 3-4 servings a day Limit red meat and sweets to only a few servings a month What are my food choices? Mediterranean diet Recommended Grains: Whole-grain pasta. Brown rice. Bulgar wheat. Polenta. Couscous. Whole-wheat bread. [place] Made[place]. Vegetables: Artichokes. Beets. Broccoli. Cabbage. Carrots. Eggplant. Green beans. Chard. Kale. Spinach. Onions. Leeks. Peas. Squash. Tomatoes. Peppers. Radishes. Fruits: Apples. Apricots. Avocado. Berries. Bananas. Cherries. Dates. Figs. Grapes. Lemons. Melon. Oranges. Peaches. Plums. Pomegranate. Meats and other protein foods: Beans. Almonds. Sunflower seeds. Pine nuts. Peanuts. Cod. Salmon. Scallops. Shrimp. Tuna. Tilapia. Clams. Oysters. Eggs. Dairy: Low-fat milk. Cheese. Greek yogurt. Beverages: Water. Red wine. Herbal tea. Fats and oils: Extra virgin olive oil. Avocado oil. Grape seed oil. Sweets and desserts: [place] yogurt with honey. Baked apples. Poached pears. Trail mix. Seasoning and other foods: Basil. Cilantro. Coriander. Cumin. Mint. Parsley. Sage. Rosemary. Tarragon.  Garlic. Oregano. Thyme. Pepper. Balsalmic vinegar. Tahini. Hummus. Tomato sauce. Olives. Mushrooms. Limit these Grains: Prepackaged pasta or rice dishes. Prepackaged cereal with added sugar. Vegetables: Deep fried potatoes (french fries). Fruits: Fruit canned in syrup. Meats and other protein foods: Beef. Pork. Lamb. Poultry with skin. Hot dogs. [place]. Dairy: Ice cream. Sour cream. Whole milk. Beverages: Juice. Sugar-sweetened soft drinks. Beer. Liquor and spirits. Fats and oils: Butter. Canola oil. Vegetable oil. Beef fat (tallow). Lard. Sweets and desserts: Cookies. Cakes. Pies. Candy. Seasoning and other foods: Mayonnaise. Premade sauces and marinades. The items listed may not be a complete list. Talk with your dietitian about what dietary choices are right for you. Summary The Mediterranean diet includes both food and lifestyle choices. Eat a variety of fresh fruits and vegetables, beans, nuts, seeds, and whole grains. Limit the amount of red meat and sweets that you eat. Talk with your health care provider about whether it is safe for you to drink red wine in moderation. This means 1 glass a day for nonpregnant women and 2 glasses a day for men. A glass of wine equals 5 oz (150 mL). This information is not intended to replace advice given to you by your health care provider. Make sure you discuss any questions you have with your health care  provider. Document Released: 06/27/2016 Document Revised: 07/30/2016 Document Reviewed: 06/27/2016 Elsevier Interactive Patient Education  2017 [employer].

## 2024-08-17 DIAGNOSIS — E785 Hyperlipidemia, unspecified: Secondary | ICD-10-CM | POA: Diagnosis not present

## 2024-08-17 DIAGNOSIS — R7301 Impaired fasting glucose: Secondary | ICD-10-CM | POA: Diagnosis not present

## 2024-08-17 DIAGNOSIS — I10 Essential (primary) hypertension: Secondary | ICD-10-CM | POA: Diagnosis not present

## 2024-08-24 DIAGNOSIS — N1831 Chronic kidney disease, stage 3a: Secondary | ICD-10-CM | POA: Diagnosis not present

## 2024-08-24 DIAGNOSIS — Z1331 Encounter for screening for depression: Secondary | ICD-10-CM | POA: Diagnosis not present

## 2024-08-24 DIAGNOSIS — R7301 Impaired fasting glucose: Secondary | ICD-10-CM | POA: Diagnosis not present

## 2024-08-24 DIAGNOSIS — Z23 Encounter for immunization: Secondary | ICD-10-CM | POA: Diagnosis not present

## 2024-08-24 DIAGNOSIS — R011 Cardiac murmur, unspecified: Secondary | ICD-10-CM | POA: Diagnosis not present

## 2024-08-24 DIAGNOSIS — G3184 Mild cognitive impairment, so stated: Secondary | ICD-10-CM | POA: Diagnosis not present

## 2024-08-24 DIAGNOSIS — I129 Hypertensive chronic kidney disease with stage 1 through stage 4 chronic kidney disease, or unspecified chronic kidney disease: Secondary | ICD-10-CM | POA: Diagnosis not present

## 2024-08-24 DIAGNOSIS — E785 Hyperlipidemia, unspecified: Secondary | ICD-10-CM | POA: Diagnosis not present

## 2024-08-24 DIAGNOSIS — Z1339 Encounter for screening examination for other mental health and behavioral disorders: Secondary | ICD-10-CM | POA: Diagnosis not present

## 2024-08-24 DIAGNOSIS — R82998 Other abnormal findings in urine: Secondary | ICD-10-CM | POA: Diagnosis not present

## 2024-08-24 DIAGNOSIS — D509 Iron deficiency anemia, unspecified: Secondary | ICD-10-CM | POA: Diagnosis not present

## 2024-08-24 DIAGNOSIS — Z0000 Encounter for general adult medical examination without abnormal findings: Secondary | ICD-10-CM | POA: Diagnosis not present

## 2024-08-24 DIAGNOSIS — M810 Age-related osteoporosis without current pathological fracture: Secondary | ICD-10-CM | POA: Diagnosis not present

## 2024-08-26 ENCOUNTER — Other Ambulatory Visit: Payer: Self-pay | Admitting: Internal Medicine

## 2024-08-26 DIAGNOSIS — G3184 Mild cognitive impairment, so stated: Secondary | ICD-10-CM

## 2024-08-30 ENCOUNTER — Ambulatory Visit (INDEPENDENT_AMBULATORY_CARE_PROVIDER_SITE_OTHER): Admitting: Psychology

## 2024-08-30 ENCOUNTER — Encounter: Payer: Self-pay | Admitting: Psychology

## 2024-08-30 ENCOUNTER — Ambulatory Visit: Payer: Self-pay

## 2024-08-30 DIAGNOSIS — F0670 Mild neurocognitive disorder due to known physiological condition without behavioral disturbance: Secondary | ICD-10-CM | POA: Insufficient documentation

## 2024-08-30 DIAGNOSIS — I10 Essential (primary) hypertension: Secondary | ICD-10-CM | POA: Insufficient documentation

## 2024-08-30 DIAGNOSIS — R4189 Other symptoms and signs involving cognitive functions and awareness: Secondary | ICD-10-CM

## 2024-08-30 NOTE — Progress Notes (Signed)
 NEUROPSYCHOLOGICAL EVALUATION [place] Department of Neurology  Date of Evaluation: August 30, 2024  Reason for Referral:   [name] is a 83 y.o. right-handed Caucasian female referred by Camie Sevin, PA-C, to characterize her current cognitive functioning and assist with diagnostic clarity and treatment planning in the context of subjective cognitive decline.   Assessment and Plan:   Clinical Impression(s): [name]'s pattern of performance is suggestive of an isolated impairment across delayed retrieval aspects of memory. Slight performance variability was also exhibited across processing speed and recognition/consolidation aspects of memory. Performances were appropriate relative to age-matched peers across attention/concentration, executive functioning, receptive and expressive language, visuospatial abilities, and learning (i.e., encoding) aspects of memory. Functionally, [name] denied difficulties completing instrumental activities of daily living (ADLs) independently. There was no collateral source of information to confirm or refute this reporting. As such, given evidence for cognitive dysfunction described above, she meets criteria for a Mild Neurocognitive Disorder (mild cognitive impairment) at the present time.  The cause for memory dysfunction remains uncertain at the present time. [name] was able to learn novel information on par with age-matched peers. However, despite adequate learning, retention rates were 0% across both list and figure-based memory tasks and only 27% across the remaining story-based memory task. This elevates concerns for rapid forgetting, which can be an early sign of neurodegenerative illness. It is encouraging that, outside of difficulty recognizing figure-based information, she did benefit from cueing across both verbal memory tasks, thus not suggestive of a prominent storage impairment at the  present time. If a neurodegenerative illness is indeed present, it would appear to be very early stages at the present time time. There is no neuroimaging available to assist with alternate theories surrounding memory dysfunction. Continued medical monitoring will be important moving forward.   Recommendations: A repeat neuropsychological evaluation in 12-18 months should be considered to assess the trajectory of future cognitive decline should it occur. This will also aid in future efforts towards improved diagnostic clarity.  If desired, [name] could discuss medication aimed to address memory loss and concerns surrounding neurodegenerative illness with [name]. It is important to highlight that this medication has been shown to slow functional decline in some individuals. There is no current treatment which can stop or reverse cognitive decline when caused by a neurodegenerative illness.   Performance across neurocognitive testing is not a strong predictor of an individual's safety operating a motor vehicle. Should her family wish to pursue a formalized driving evaluation, they could reach out to the following agencies: [facility] [employer] [facility] in [place]: [phone] [facility]: [phone] [facility]: [phone] [facility]: [phone] or [phone]  Should there be progression of current deficits over time, [name] is unlikely to regain any independent living skills lost. Therefore, it is recommended that she remain as involved as possible in all aspects of household chores, finances, and medication management, with supervision to ensure adequate performance. She will likely benefit from the establishment and maintenance of a routine in order to maximize her functional abilities over time.  It will be important for [name] to have another person with her when in situations where she may need to process information, weigh the pros  and cons of different options, and make decisions, in order to ensure that she fully understands and recalls all information to be considered.  If not already done, [name] and her family may want to discuss her wishes regarding durable  power of attorney and medical decision making, so that she can have input into these choices. If they require legal assistance with this, long-term care resource access, or other aspects of estate planning, they could reach out to [employer] at [phone] for a free consultation. Additionally, they may wish to discuss future plans for caretaking and seek out community options for in home/residential care should they become necessary.  [name] is encouraged to attend to lifestyle factors for brain health (e.g., regular physical exercise, good nutrition habits and consideration of the MIND-DASH diet, regular participation in cognitively-stimulating activities, and general stress management techniques), which are likely to have benefits for both emotional adjustment and cognition. Optimal control of vascular risk factors (including safe cardiovascular exercise and adherence to dietary recommendations) is encouraged. Continued participation in activities which provide mental stimulation and social interaction is also recommended.   Important information should be provided to [name] in written format in all instances. This information should be placed in a highly frequented and easily visible location within her home to promote recall. External strategies such as written notes in a consistently used memory journal, visual and nonverbal auditory cues such as a calendar on the refrigerator or appointments with alarm, such as on a cell phone, can also help maximize recall.  To address problems with processing speed, she may wish to consider:   -Ensuring that she is alerted when essential material or instructions are being presented   -Adjusting the speed at  which new information is presented   -Allowing for more time in comprehending, processing, and responding in conversation   -Repeating and paraphrasing instructions or conversations aloud  Review of Records:   Past Medical History:  Diagnosis Date   Allergy    seasonal allergies, pcn   Arthritis    Cataract 2012   bilateral extractions.lens implant   Hyperlipidemia 2005   on medication   Hypertension    Iron deficiency anemia 11/04/2019   Murmur    Osteoporosis, postmenopausal 07/27/2024    Past Surgical History:  Procedure Laterality Date   CATARACT EXTRACTION, BILATERAL     COLONOSCOPY     2005   COSMETIC SURGERY     face after accident childhood- extensive per   TONSILLECTOMY     TOTAL KNEE ARTHROPLASTY     bilat    Current Outpatient Medications:    alendronate (FOSAMAX) 70 MG tablet, Take 70 mg by mouth once a week. Take with a full glass of water on an empty stomach., Disp: , Rfl:    atorvastatin (LIPITOR) 10 MG tablet, Take 10 mg by mouth daily., Disp: , Rfl:    CALCIUM CITRATE PO, Take 1 tablet by mouth daily., Disp: , Rfl:    Cholecalciferol (VITAMIN D3) 50 MCG (2000 UT) TABS, Take 1 tablet by mouth daily., Disp: , Rfl:    Coenzyme Q10 (COQ10) 100 MG CAPS, Take 1 capsule by mouth daily., Disp: , Rfl:    Cyanocobalamin  (B-12) 2500 MCG TABS, Take 1 tablet by mouth daily., Disp: , Rfl:    diclofenac Sodium (VOLTAREN) 1 % GEL, Apply 1 application topically as needed., Disp: , Rfl:    docusate sodium (COLACE) 50 MG capsule, Take 50 mg by mouth daily., Disp: , Rfl:    ferrous sulfate 325 (65 FE) MG EC tablet, Take 1 tablet (325 mg total) by mouth 2 (two) times daily. Take three times a day if tolerated, Disp:  , Rfl:    lisinopril-hydrochlorothiazide (ZESTORETIC) 20-25 MG tablet,  Take 1 tablet by mouth daily., Disp: , Rfl:    loratadine (CLARITIN) 10 MG tablet, Take 10 mg by mouth daily as needed for allergies., Disp: , Rfl:    Magnesium 250 MG TABS, Take 1 tablet by  mouth daily., Disp: , Rfl:    Multiple Vitamins-Minerals (ICAPS AREDS 2 PO), Take 1 capsule by mouth daily., Disp: , Rfl:    zolpidem  (AMBIEN ) 10 MG tablet, Take 10 mg by mouth at bedtime as needed for sleep., Disp: , Rfl:        08/12/2024   12:00 PM  Montreal Cognitive Assessment   Visuospatial/ Executive (0/5) 4  Naming (0/3) 3  Attention: Read list of digits (0/2) 2  Attention: Read list of letters (0/1) 1  Attention: Serial 7 subtraction starting at 100 (0/3) 3  Language: Repeat phrase (0/2) 2  Language : Fluency (0/1) 1  Abstraction (0/2) 2  Delayed Recall (0/5) 3  Orientation (0/6) 5  Total 26  Adjusted Score (based on education) 26   Neuroimaging: No neuroimaging was available for review. A brain MRI is scheduled to be completed on 09/27/2024.  Clinical Interview:   The following information was obtained during a clinical interview with Ms. Marik prior to cognitive testing.  Cognitive Symptoms: Decreased short-term memory: Largely denied. She acknowledged some generalized concerns at times, but did not report any consistent observations or areas that create personal concern.  Decreased long-term memory: Denied. Decreased attention/concentration: Denied. Reduced processing speed: Denied. Difficulties with executive functions: Endorsed. Specifically, she alluded to her having a greater difficulty with multi-tasking. She remains highly organized. No trouble with impulsivity or any report of personality change was reported.  Difficulties with emotion regulation: Denied. Difficulties with receptive language: Denied. Difficulties with word finding: Denied. Decreased visuoperceptual ability: Denied.  Difficulties completing ADLs: Denied.  Additional Medical History: History of traumatic brain injury/concussion: Unclear. She reported experiencing a major fall when I was little, highlighting that she required some form of reconstructive surgery surrounding the front of  her face and lip/mouth region. She also reported a fall that occurred this past July 2025, stemming from her slipping on a floor that had no traction. She injured her right shoulder but denied to her knowledge any direct head impact. No other head injury concerns were reported. History of stroke: Denied. History of seizure activity: Denied. History of known exposure to toxins: Denied. Symptoms of chronic pain: She reported some arthritic pain from time to time, as well as ongoing right shoulder pain stemming from her fall this past July.  Experience of frequent headaches/migraines: Denied. Frequent instances of dizziness/vertigo: Denied.  Sensory changes: She utilizes glasses with benefit. Other sensory changes/difficulties (e.g., hearing, taste, or smell) were denied.  Balance/coordination difficulties: Denied. She described her balance as pretty good and denied any recurring falls. She did acknowledge being more cautious while walking, especially while in crowds, and will sometimes carry a walking stick as a precaution.  Other motor difficulties: Denied.  Other medical conditions: Denied.  Sleep History: Estimated hours obtained each night: 7-8 hours.  Difficulties falling asleep: Denied. Difficulties staying asleep: Denied. Feels rested and refreshed upon awakening: Endorsed.  History of snoring: Denied. History of waking up gasping for air: Denied. Witnessed breath cessation while asleep: Denied.  History of vivid dreaming: Denied. Excessive movement while asleep: Denied. Instances of acting out her dreams: Denied.  Psychiatric/Behavioral Health History: Depression: She described her current mood as okay. She alluded to some logistical stress as her and her husband have  had a large number of physician appointments lately, more than what they are used to. Outside of this, she denied any prior mental health concerns or formal diagnoses. Current or remote suicidal ideation,  intent, or plan was denied.  Anxiety: Denied. Mania: Denied. Trauma History: Denied. Visual/auditory hallucinations: Denied. Delusional thoughts: Denied.  Tobacco: Denied. Alcohol : She reported consuming one glass of wine nightly and denied any prior concerns surrounding alcohol  abuse or dependence.  Recreational drugs: Denied.  Family History: Problem Relation Age of Onset   Colon cancer Maternal Aunt    Breast cancer Maternal Aunt    Diabetes Maternal Aunt    Diabetes Maternal Grandfather    Esophageal cancer Neg Hx    Rectal cancer Neg Hx    Stomach cancer Neg Hx    This information was confirmed by Ms. Eisner.  Academic/Vocational History: Highest level of educational attainment: 18 years. She earned a Manufacturing engineer in teaching. She described herself as a strong (A/B) student in academic settings. Physics was noted as a likely relative weakness.  History of developmental delay: Denied. History of grade repetition: Denied. Enrollment in special education courses: Denied. History of LD/ADHD: Denied.  Employment: Retired. She previously worked as a Runner, broadcasting/film/video.   Evaluation Results:   Behavioral Observations: Ms. Manges was unaccompanied, arrived to her appointment on time, and was appropriately dressed and groomed. She appeared alert. Observed gait and station were mildly slowed but within normal limits. Gross motor functioning appeared intact upon informal observation and no abnormal movements (e.g., tremors) were noted. Her affect was generally relaxed and positive. Spontaneous speech was fluent and word finding difficulties were not observed during the clinical interview. Thought processes were coherent, organized, and normal in content. Insight into her cognitive difficulties appeared somewhat limited and I do have concern that Ms. Loree does not fully appreciate ongoing memory dysfunction.   During testing, sustained attention was appropriate. Task engagement was  adequate and she persisted when challenged. Overall, Ms. Regas was cooperative with the clinical interview and subsequent testing procedures.   Adequacy of Effort: The validity of neuropsychological testing is limited by the extent to which the individual being tested may be assumed to have exerted adequate effort during testing. Ms. Brasington expressed her intention to perform to the best of her abilities and exhibited adequate task engagement and persistence. Scores across stand-alone and embedded performance validity measures were within expectation. As such, the results of the current evaluation are believed to be a valid representation of Ms. Holden's current cognitive functioning.  Test Results: Ms. Dufford was mildly disoriented at the time of the current evaluation. She was unable to state the current date or day of the week and was nearly two hours off when estimating the current time. When asked why she was here, she responded Because an appointment was scheduled. I was not a part of the discussion.  Intellectual abilities based upon educational and vocational attainment were estimated to be in the average to above average range. Premorbid abilities were estimated to be within the above average range based upon a single-word reading test.   Processing speed was average outside of a single visuomotor sequencing task (TMT A) which scored in the well below average range. Basic attention was average to above average. More complex attention (e.g., working memory) was average. Executive functioning was average to well above average.  Assessed receptive language abilities were above average. Likewise, Ms. Overturf did not exhibit any difficulties comprehending task instructions and answered all questions asked of her  appropriately. Assessed expressive language (e.g., verbal fluency and confrontation naming) was mildly variable but overall appropriate, ranging from the below average to above  average normative ranges.     Assessed visuospatial/visuoconstructional abilities were average to above average. A single point was lost on her drawing of a clock due to there being no size differentiation between the clock hands.    Learning (i.e., encoding) of novel verbal information was below average to average. Spontaneous delayed recall (i.e., retrieval) of previously learned information was exceptionally low to well below average. Retention rates were 0% across a list learning task, 27% across a story learning task, and 0% across a figure drawing task. Performance across recognition tasks was variable, ranging from the exceptionally low to average normative ranges, suggesting some evidence for information consolidation.   Results of emotional screening instruments suggested that recent symptoms of generalized anxiety were in the minimal range, while symptoms of depression were within normal limits. A screening instrument assessing recent sleep quality suggested the presence of minimal sleep dysfunction.  Table of Scores:   Note: This summary of test scores accompanies the interpretive report and should not be considered in isolation without reference to the appropriate sections in the text. Descriptors are based on appropriate normative data and may be adjusted based on clinical judgment. Terms such as Within Normal Limits and Outside Normal Limits are used when a more specific description of the test score cannot be determined.       Percentile - Normative Descriptor > 98 - Exceptionally High 91-97 - Well Above Average 75-90 - Above Average 25-74 - Average 9-24 - Below Average 2-8 - Well Below Average < 2 - Exceptionally Low       Validity:   DESCRIPTOR       DCT: --- --- Within Normal Limits  RBANS EI: --- --- Within Normal Limits       Orientation:      Raw Score Percentile   NAB Orientation, Form 1 23/29 --- ---       Cognitive Screening:      Raw Score Percentile    SLUMS: 25/30 --- ---       RBANS, Form A: Standard Score/ Scaled Score Percentile   Total Score 97 42 Average  Immediate Memory 87 19 Below Average    List Learning 6 9 Below Average    Story Memory 10 50 Average  Visuospatial/Constructional 116 86 Above Average    Figure Copy 11 63 Average    Line Orientation 19/20 >75 Above Average  Language 97 42 Average    Picture Naming 10/10 >75 Above Average    Semantic Fluency 8 25 Average  Attention 109 73 Average    Digit Span 12 75 Above Average    Coding 11 63 Average  Delayed Memory 82 12 Below Average    List Recall 0/10 <2 Exceptionally Low    List Recognition 20/20 51-75 Average    Story Recall 5 5 Well Below Average    Story Recognition 9/12 29-52 Average    Figure Recall 1 <1 Exceptionally Low    Figure Recognition 2/8 1-5 Exceptionally Low        Intellectual Functioning:      Standard Score Percentile   Test of Premorbid Functioning: 117 87 Above Average       Attention/Executive Function:     Trail Making Test (TMT): Raw Score (T Score) Percentile     Part A 57 secs.,  0 errors (36) 8 Well Below Average  Part B 102 secs.,  0 errors (49) 46 Average         Scaled Score Percentile   WAIS-5 Coding: 8 25 Average  WAIS-5 Naming Speed Quantity: 10 50 Average        Scaled Score Percentile   WAIS-5 Digits Forwards: 8 25 Average  WAIS-5 Digit Sequencing: 10 50 Average        Scaled Score Percentile   WAIS-5 Similarities: 13 84 Above Average  WAIS-5 Figure Weights: 14 91 Well Above Average       D-KEFS Verbal Fluency Test: Raw Score (Scaled Score) Percentile     Letter Total Correct 34 (11) 63 Average    Category Total Correct 29 (10) 50 Average    Category Switching Total Correct 10 (9) 37 Average    Category Switching Accuracy 9 (10) 50 Average      Total Set Loss Errors 4 (8) 25 Average      Total Repetition Errors 4 (9) 37 Average       Language:     Verbal Fluency Test: Raw Score (T Score) Percentile      Phonemic Fluency (FAS) 34 (42) 21 Below Average    Animal Fluency 15 (42) 21 Below Average        NAB Language Module, Form 1: T Score Percentile     Auditory Comprehension 58 79 Above Average    Naming 29/31 (51) 54 Average       Visuospatial/Visuoconstruction:      Raw Score Percentile   Clock Drawing: 9/10 --- Within Normal Limits        Scaled Score Percentile   WAIS-5 Block Design: 13 84 Above Average       Mood and Personality:      Raw Score Percentile   Geriatric Depression Scale: 2 --- Within Normal Limits  Geriatric Anxiety Scale: 2 --- Minimal    Somatic 1 --- Minimal    Cognitive 0 --- Minimal    Affective 1 --- Minimal       Additional Questionnaires:      Raw Score Percentile   PROMIS Sleep Disturbance Questionnaire: 22 --- None to Slight   Informed Consent and Coding/Compliance:   The current evaluation represents a clinical evaluation for the purposes previously outlined by the referral source and is in no way reflective of a forensic evaluation.   Ms. Alvarenga was provided with a verbal description of the nature and purpose of the present neuropsychological evaluation. Also reviewed were the foreseeable risks and/or discomforts and benefits of the procedure, limits of confidentiality, and mandatory reporting requirements of this provider. The patient was given the opportunity to ask questions and receive answers about the evaluation. Oral consent to participate was provided by the patient.   This evaluation was conducted by Arthea KYM Maryland, Ph.D., ABPP-CN, board certified clinical neuropsychologist. Ms. Holsonback completed a clinical interview with Dr. Maryland, billed as one unit [phone], and 165 minutes of cognitive testing and scoring, billed as one unit [phone] and four additional units [id number]. Psychometrist Evalene Pizza, B.S. assisted Dr. Maryland with test administration and scoring procedures. As a separate and discrete service, one unit [phone] and two units 96133 (160  minutes) were billed for Dr. Loralee time spent in interpretation and report writing.

## 2024-08-30 NOTE — Progress Notes (Signed)
   Psychometrician Note   Cognitive testing was administered to [name] by Evalene Pizza, B.S. (psychometrist) under the supervision of Dr. Arthea KYM Maryland, Ph.D., ABPP, licensed psychologist on 08/30/2024. [name] did not appear overtly distressed by the testing session per behavioral observation or responses across self-report questionnaires. Rest breaks were offered.   The battery of tests administered was selected by Dr. Zachary C. Merz, Ph.D., ABPP with consideration to [name] current level of functioning, the nature of her symptoms, emotional and behavioral responses during interview, level of literacy, observed level of motivation/effort, and the nature of the referral question. This battery was communicated to the psychometrist. Communication between Dr. Arthea KYM Maryland, Ph.D., ABPP and the psychometrist was ongoing throughout the evaluation and Dr. Arthea KYM Maryland, Ph.D., ABPP was immediately accessible at all times. Dr. Zachary C. Merz, Ph.D., ABPP provided supervision to the psychometrist on the date of this service to the extent necessary to assure the quality of all services provided.    [name] will return within approximately 1-2 weeks for an interactive feedback session with Dr. Maryland at which time her test performances, clinical impressions, and treatment recommendations will be reviewed in detail. [name] understands she can contact our office should she require our assistance before this time.  A total of 165 minutes of billable time were spent face-to-face with [name] by the psychometrist. This includes both test administration and scoring time. Billing for these services is reflected in the clinical report generated by Dr. Arthea KYM Maryland, Ph.D., ABPP  This note reflects time spent with the psychometrician and does not include test scores or any clinical interpretations made by Dr. Maryland. The full report will follow in a separate note.

## 2024-08-31 ENCOUNTER — Ambulatory Visit (HOSPITAL_COMMUNITY)
Admission: RE | Admit: 2024-08-31 | Discharge: 2024-08-31 | Disposition: A | Discharge status: Home / Self Care | Source: Ambulatory Visit | Attending: Internal Medicine | Admitting: Internal Medicine

## 2024-08-31 VITALS — BP 147/65 | HR 82 | Temp 97.8°F | Resp 17

## 2024-08-31 DIAGNOSIS — M810 Age-related osteoporosis without current pathological fracture: Secondary | ICD-10-CM | POA: Diagnosis not present

## 2024-08-31 MED ORDER — ROMOSOZUMAB-AQQG 105 MG/1.17ML ~~LOC~~ SOSY
PREFILLED_SYRINGE | SUBCUTANEOUS | Status: AC
  Filled 2024-08-31: qty 1.17

## 2024-08-31 MED ORDER — ROMOSOZUMAB-AQQG 105 MG/1.17ML ~~LOC~~ SOSY
210.0000 mg | PREFILLED_SYRINGE | Freq: Once | SUBCUTANEOUS | Status: AC
  Administered 2024-08-31: 210 mg via SUBCUTANEOUS

## 2024-09-06 ENCOUNTER — Ambulatory Visit (INDEPENDENT_AMBULATORY_CARE_PROVIDER_SITE_OTHER): Admitting: Psychology

## 2024-09-06 DIAGNOSIS — F0670 Mild neurocognitive disorder due to known physiological condition without behavioral disturbance: Secondary | ICD-10-CM

## 2024-09-06 NOTE — Progress Notes (Signed)
   Neuropsychology Feedback Session Jolynn DEL. [facility] [place] Department of Neurology  Reason for Referral:   ACSA [name] is a 83 y.o. right-handed Caucasian female referred by Camie Sevin, PA-C, to characterize her current cognitive functioning and assist with diagnostic clarity and treatment planning in the context of subjective cognitive decline.   Feedback:   [name] completed a comprehensive neuropsychological evaluation on 08/30/2024. Please refer to that encounter for the full report and recommendations. Briefly, results suggested an isolated impairment across delayed retrieval aspects of memory. Slight performance variability was also exhibited across processing speed and recognition/consolidation aspects of memory. Performances were appropriate relative to age-matched peers across attention/concentration, executive functioning, receptive and expressive language, visuospatial abilities, and learning (i.e., encoding) aspects of memory. The cause for memory dysfunction remains uncertain at the present time. [name] was able to learn novel information on par with age-matched peers. However, despite adequate learning, retention rates were 0% across both list and figure-based memory tasks and only 27% across the remaining story-based memory task. This elevates concerns for rapid forgetting, which can be an early sign of neurodegenerative illness. It is encouraging that, outside of difficulty recognizing figure-based information, she did benefit from cueing across both verbal memory tasks, thus not suggestive of a prominent storage impairment at the present time. If a neurodegenerative illness is indeed present, it would appear to be very early stages at the present time time. There is no neuroimaging available to assist with alternate theories surrounding memory dysfunction. Continued medical monitoring will be important moving forward.  [name] was accompanied by  her son in person and her daughter via FaceTime during the current feedback session. Content of the current session focused on the results of her neuropsychological evaluation. [name] was given the opportunity to ask questions and her questions were answered. She was encouraged to reach out should additional questions arise. A copy of her report was provided/ at the conclusion of the visit.      One unit 96132 (33 minutes) was billed for Dr. Loralee time spent preparing for, conducting, and documenting the current feedback session with [name].

## 2024-09-13 ENCOUNTER — Other Ambulatory Visit (HOSPITAL_COMMUNITY): Payer: Self-pay | Admitting: Internal Medicine

## 2024-09-13 DIAGNOSIS — R011 Cardiac murmur, unspecified: Secondary | ICD-10-CM

## 2024-09-27 ENCOUNTER — Other Ambulatory Visit: Payer: Self-pay | Admitting: Internal Medicine

## 2024-09-27 ENCOUNTER — Ambulatory Visit
Admission: RE | Admit: 2024-09-27 | Discharge: 2024-09-27 | Disposition: A | Discharge status: Home / Self Care | Source: Ambulatory Visit | Attending: Internal Medicine | Admitting: Internal Medicine

## 2024-09-27 DIAGNOSIS — G3184 Mild cognitive impairment, so stated: Secondary | ICD-10-CM

## 2024-09-27 DIAGNOSIS — R413 Other amnesia: Secondary | ICD-10-CM | POA: Diagnosis not present

## 2024-09-28 ENCOUNTER — Ambulatory Visit (HOSPITAL_COMMUNITY)
Admission: RE | Admit: 2024-09-28 | Discharge: 2024-09-28 | Disposition: A | Discharge status: Home / Self Care | Source: Ambulatory Visit | Attending: Internal Medicine | Admitting: Internal Medicine

## 2024-09-28 VITALS — BP 171/62 | HR 66 | Temp 97.3°F | Resp 16

## 2024-09-28 DIAGNOSIS — M810 Age-related osteoporosis without current pathological fracture: Secondary | ICD-10-CM | POA: Insufficient documentation

## 2024-09-28 MED ORDER — ROMOSOZUMAB-AQQG 105 MG/1.17ML ~~LOC~~ SOSY
PREFILLED_SYRINGE | SUBCUTANEOUS | Status: AC
  Filled 2024-09-28: qty 1.17

## 2024-09-28 MED ORDER — ROMOSOZUMAB-AQQG 105 MG/1.17ML ~~LOC~~ SOSY
210.0000 mg | PREFILLED_SYRINGE | Freq: Once | SUBCUTANEOUS | Status: AC
  Administered 2024-09-28: 210 mg via SUBCUTANEOUS

## 2024-09-30 DIAGNOSIS — S81811A Laceration without foreign body, right lower leg, initial encounter: Secondary | ICD-10-CM | POA: Diagnosis not present

## 2024-09-30 DIAGNOSIS — L03115 Cellulitis of right lower limb: Secondary | ICD-10-CM | POA: Diagnosis not present

## 2024-10-01 ENCOUNTER — Encounter (HOSPITAL_COMMUNITY): Payer: Self-pay | Admitting: Internal Medicine

## 2024-10-04 DIAGNOSIS — L03115 Cellulitis of right lower limb: Secondary | ICD-10-CM | POA: Diagnosis not present

## 2024-10-04 DIAGNOSIS — S81811D Laceration without foreign body, right lower leg, subsequent encounter: Secondary | ICD-10-CM | POA: Diagnosis not present

## 2024-10-04 DIAGNOSIS — Z5189 Encounter for other specified aftercare: Secondary | ICD-10-CM | POA: Diagnosis not present

## 2024-10-06 ENCOUNTER — Ambulatory Visit (HOSPITAL_COMMUNITY)
Admission: RE | Admit: 2024-10-06 | Discharge: 2024-10-06 | Disposition: A | Discharge status: Home / Self Care | Source: Ambulatory Visit | Attending: Internal Medicine | Admitting: Internal Medicine

## 2024-10-06 DIAGNOSIS — I083 Combined rheumatic disorders of mitral, aortic and tricuspid valves: Secondary | ICD-10-CM | POA: Diagnosis not present

## 2024-10-06 DIAGNOSIS — T148XXD Other injury of unspecified body region, subsequent encounter: Secondary | ICD-10-CM | POA: Diagnosis not present

## 2024-10-06 DIAGNOSIS — I371 Nonrheumatic pulmonary valve insufficiency: Secondary | ICD-10-CM | POA: Insufficient documentation

## 2024-10-06 DIAGNOSIS — R011 Cardiac murmur, unspecified: Secondary | ICD-10-CM | POA: Diagnosis present

## 2024-10-06 DIAGNOSIS — S81811D Laceration without foreign body, right lower leg, subsequent encounter: Secondary | ICD-10-CM | POA: Diagnosis not present

## 2024-10-06 LAB — ECHOCARDIOGRAM COMPLETE
AR max vel: 2.16 cm2
AV Area VTI: 2.52 cm2
AV Area mean vel: 2.06 cm2
AV Mean grad: 6 mmHg
AV Peak grad: 11.3 mmHg
Ao pk vel: 1.68 m/s
Area-P 1/2: 3.6 cm2
Calc EF: 69.6 %
MV VTI: 5.38 cm2
S' Lateral: 2.7 cm
Single Plane A2C EF: 72.4 %
Single Plane A4C EF: 68.5 %

## 2024-10-11 ENCOUNTER — Encounter (HOSPITAL_COMMUNITY): Payer: Self-pay | Admitting: Internal Medicine

## 2024-10-28 ENCOUNTER — Encounter: Payer: Self-pay | Admitting: Physician Assistant

## 2024-10-28 ENCOUNTER — Ambulatory Visit (INDEPENDENT_AMBULATORY_CARE_PROVIDER_SITE_OTHER): Admitting: Physician Assistant

## 2024-10-28 ENCOUNTER — Ambulatory Visit (HOSPITAL_COMMUNITY)
Admission: RE | Admit: 2024-10-28 | Discharge: 2024-10-28 | Disposition: A | Discharge status: Home / Self Care | Source: Ambulatory Visit | Attending: Internal Medicine | Admitting: Internal Medicine

## 2024-10-28 VITALS — BP 152/60 | HR 90 | Temp 97.3°F | Resp 15

## 2024-10-28 VITALS — BP 172/72 | HR 62 | Resp 20 | Wt 135.0 lb

## 2024-10-28 DIAGNOSIS — M810 Age-related osteoporosis without current pathological fracture: Secondary | ICD-10-CM | POA: Insufficient documentation

## 2024-10-28 DIAGNOSIS — F0670 Mild neurocognitive disorder due to known physiological condition without behavioral disturbance: Secondary | ICD-10-CM | POA: Diagnosis not present

## 2024-10-28 MED ORDER — ROMOSOZUMAB-AQQG 105 MG/1.17ML ~~LOC~~ SOSY
210.0000 mg | PREFILLED_SYRINGE | Freq: Once | SUBCUTANEOUS | Status: AC
  Administered 2024-10-28: 210 mg via SUBCUTANEOUS

## 2024-10-28 MED ORDER — DONEPEZIL HCL 5 MG PO TABS: 5.0000 mg | ORAL_TABLET | Freq: Every morning | ORAL | 11 refills | Status: AC

## 2024-10-28 MED ORDER — ROMOSOZUMAB-AQQG 105 MG/1.17ML ~~LOC~~ SOSY
PREFILLED_SYRINGE | SUBCUTANEOUS | Status: AC
  Filled 2024-10-28: qty 1.17

## 2024-10-28 NOTE — Progress Notes (Signed)
 Mild cognitive impairment with memory loss of multiple etiologies    [name] is a very pleasant 83 y.o. RH female with a history of hypertension, hyperlipidemia, iron deficiency anemia and a diagnosis of MCI with memory loss due to multiple etiologies per neuropsych evaluation 08/30/2024, presenting today in follow-up for evaluation of memory loss. Patient was last seen on 08/12/24. Memory is stable. Patient is able to participate on ADLs and to drive without difficulties.  Mood is good.     Repeat neuropsych evaluation in 9 to 15 months for diagnostic clarity and disease trajectory Start donepezil 10 mg daily as directed, side effects discussed  Continue to control mood as per PCP Recommend good control of cardiovascular risk factors Follow-up in 6 months    Discussed the use of AI scribe software for clinical note transcription with the patient, who gave verbal consent to proceed.  History of Present Illness [name] is an 83 year old female with mild cognitive impairment who presents for a follow-up visit.  There have been no significant changes in her memory since her last visit in September, although she continues to experience short-term memory issues. Occasionally she repeats herself but she attributes it to double checking.  She maintains her routine of writing notes to herself and enjoys activities such as watching TV, reading books, and participating in garden and book clubs. She regularly socializes by going out to eat with smaller groups of people  due to their decreased hearing, we now go in smaller groups.   She has stopped taking Ambien  10 mg and has difficulty sleeping since discontinuing it. She prefers to manage without medication but occasionally takes melatonin, usually one milligram, to aid sleep. She denies nightmares or dream reenactment and reports no history of sleep apnea.  She states I am trying to do anything I can did not lose the memory   She  continues to manage household responsibilities, including taking care of her husband, handling bills, medications, and meals. No disorientation at home, no issues with losing items, and no wandering tendencies. She is still driving and uses MapQuest for navigation to new places. No personality or mood changes, hallucinations, paranoia, or seizures.  No changes in appetite and maintains a healthy diet, avoiding fried foods and focusing on fresh ingredients. No trouble swallowing, choking, or forgetting recipes. She denies any recent falls or head injuries, vision changes, or worsening urinary incontinence. Manages medications and finances independently.  She drinks two cups of coffee and tea daily, switching to decaf and avoiding caffeine late at night. She lives with her husband and a dog. She has a history of arthritis, which occasionally flares up, but she does not take pain medication regularly. She denies headaches or chronic pain.      Initial visit 08/12/2024 How long did patient have memory difficulties? Dr. Loreli made the recommendation as a baseline.  She had a fall in July 2025 at which time she could not provide great information as she normally would so there was a concern about memory. I do a little backup for myself, I write myself a note. Likes watching TV, book and Garden club, stays active.  repeats oneself?  Endorsed by her daughter.  She doesDouble checking.  A few of her friends reached to her daughter due to this issue Disoriented when walking into a room? Denies    Leaving objects in unusual places?  Denies.   Wandering behavior? Denies.   Any personality changes, or depression,  anxiety?  She does everything for her husband, from taking care of the bills, medications and meals, appointments, etc., which may be exhausting at times.   Hallucinations or paranoia? Denies.   Seizures? Denies.    Any sleep changes?  Sleeps well, denies frequent nightmares or dream  reenactment, other REM behavior or sleepwalking   Sleep apnea? Denies.   Any hygiene concerns?  Denies.   Independent of bathing and dressing? Endorsed  Does the patient need help with medications?  Patient is in charge    Who is in charge of the finances? Patient  is in charge     Any changes in appetite?   Denies.      Patient have trouble swallowing?  Denies.   Does the patient cook?  yes, denies forgetting common recipes or kitchen accidents   Any headaches?  Denies.   Chronic pain? Denies.   Ambulates with difficulty? Denies. Does not walk frequently. Recent falls or head injuries? Mechanical fall maybe it was the shoes and the floor,  05/2024 hitting her R arm, no LOC or head injury     Vision changes?  Denies any new issues.    Any strokelike symptoms? Denies.   Any tremors? Denies.  Any anosmia? Denies.   Any incontinence of urine? Denies.   Any bowel dysfunction? Denies.      Patient lives with her husband   History of heavy alcohol  intake? Denies.   History of heavy tobacco use? Denies.   Coffee 2 cups a day and tea throughout he day  Family history of dementia?  Denies Does patient drive? yes, denies getting lost.   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 Slight performance variability was also exhibited across processing speed and recognition/consolidation aspects of memory. Performances were appropriate relative to age-matched peers across attention/concentration, executive functioning, receptive and expressive language, visuospatial abilities, and learning (i.e., encoding) aspects of memory. The cause for memory dysfunction remains uncertain at the present time. [name] was able to learn novel information on par with age-matched peers. However, despite adequate learning, retention rates were 0% across both list and  figure-based memory tasks and only 27% across the remaining story-based memory task. This elevates concerns for rapid forgetting, which can be an early sign of neurodegenerative illness. It is encouraging that, outside of difficulty recognizing figure-based information, she did benefit from cueing across both verbal memory tasks, thus not suggestive of a prominent storage impairment at the present time. If a neurodegenerative illness is indeed present, it would appear to be very early stages at the present time. There is no neuroimaging available to assist with alternate theories surrounding memory dysfunction. Continued medical monitoring will be important moving forward.    MRI of the brain personally reviewed 09/27/2024 remarkable for generalized volume loss without lobar predominance, no acute findings  Past Medical History:  Diagnosis Date   Allergy    seasonal allergies, pcn   Arthritis    Cataract 2012   bilateral extractions.lens implant   Hyperlipidemia 2005   on medication   Hypertension    Iron deficiency anemia 11/04/2019   Mild neurocognitive disorder with memory loss 08/30/2024   Murmur    Osteoporosis, postmenopausal 07/27/2024     Past Surgical History:  Procedure Laterality Date   CATARACT EXTRACTION, BILATERAL     COLONOSCOPY     2005   COSMETIC SURGERY  face after accident childhood- extensive per   TONSILLECTOMY     TOTAL KNEE ARTHROPLASTY     bilat      Results RADIOLOGY Brain MRI: Age-related changes with generalized cerebral atrophy without lobe predominance; no acute findings such as stroke, mass, or hemorrhage. (09/2024)     Objective:     PHYSICAL EXAMINATION:    VITALS:   Vitals:   10/28/24 1115  BP: (!) 172/72  Pulse: 62  Resp: 20  SpO2: 100%  Weight: 135 lb (61.2 kg)    GEN:  The patient appears stated age and is in NAD. HEENT:  Normocephalic, atraumatic.   Neurological examination:  General: NAD, well-groomed, appears stated  age. Orientation: The patient is alert. Oriented to person, place and to date.  Cranial nerves: There is good facial symmetry.The speech is fluent and clear. No aphasia or dysarthria. Fund of knowledge is appropriate. Recent memory impaired and remote memory is normal.  Attention and concentration are normal.  Able to name objects and repeat phrases.  Hearing is intact to conversational tone.    Sensation: Sensation is intact to light touch throughout Motor: Strength is at least antigravity x4. DTR's 2/4 in UE/LE      08/12/2024   12:00 PM  Montreal Cognitive Assessment   Visuospatial/ Executive (0/5) 4  Naming (0/3) 3  Attention: Read list of digits (0/2) 2  Attention: Read list of letters (0/1) 1  Attention: Serial 7 subtraction starting at 100 (0/3) 3  Language: Repeat phrase (0/2) 2  Language : Fluency (0/1) 1  Abstraction (0/2) 2  Delayed Recall (0/5) 3  Orientation (0/6) 5  Total 26  Adjusted Score (based on education) 26        No data to display            Movement examination: Tone: There is normal tone in the UE/LE Abnormal movements:  no tremor.  No myoclonus.  No asterixis.   Coordination:  There is no decremation with RAM's. Normal FTN Gait and Station: The patient has no difficulty arising out of a deep-seated chair without the use of the hands. The patient's stride length is good.  Kyphosis noted.  Gait is cautious and mildly broad-based.   Thank you for allowing us  the opportunity to participate in the care of this nice patient. Please do not hesitate to contact us  for any questions or concerns.   Total time spent on today's visit was 40 minutes dedicated to this patient today, preparing to see patient, examining the patient, ordering tests and/or medications and counseling the patient, documenting clinical information in the EHR or other health record, independently interpreting results and communicating results to the patient/family, discussing treatment and  goals, answering patient's questions and coordinating care.  Cc:  Loreli Elsie JONETTA Mickey., MD  Camie Sevin 10/28/2024 12:16 PM

## 2024-10-28 NOTE — Patient Instructions (Signed)
 It was a pleasure to see you today at our office.   Recommendations:   Follow up June 22 at 11:30  Start donepezil  5 mg daily.       [email or web address]   RECOMMENDATIONS FOR ALL PATIENTS WITH MEMORY PROBLEMS: 1. Continue to exercise (Recommend 30 minutes of walking everyday, or 3 hours every week) 2. Increase social interactions - continue going to [place] and enjoy social gatherings with friends and family 3. Eat healthy, avoid fried foods and eat more fruits and vegetables 4. Maintain adequate blood pressure, blood sugar, and blood cholesterol level. Reducing the risk of stroke and cardiovascular disease also helps promoting better memory. 5. Avoid stressful situations. Live a simple life and avoid aggravations. Organize your time and prepare for the next day in anticipation. 6. Sleep well, avoid any interruptions of sleep and avoid any distractions in the bedroom that may interfere with adequate sleep quality 7. Avoid sugar, avoid sweets as there is a strong link between excessive sugar intake, diabetes, and cognitive impairment We discussed the Mediterranean diet, which has been shown to help patients reduce the risk of progressive memory disorders and reduces cardiovascular risk. This includes eating fish, eat fruits and green leafy vegetables, nuts like almonds and hazelnuts, walnuts, and also use olive oil. Avoid fast foods and fried foods as much as possible. Avoid sweets and sugar as sugar use has been linked to worsening of memory function.  There is always a concern of gradual progression of memory problems. If this is the case, then we may need to adjust level of care according to patient needs. Support, both to the patient and caregiver, should then be put into place.        DRIVING: Regarding driving, in patients with progressive memory problems, driving will be impaired. We advise to have someone else do the driving if  trouble finding directions or if minor accidents are reported. Independent driving assessment is available to determine safety of driving.   If you are interested in the driving assessment, you can contact the following:  [employer] in [place] [phone]  [facility] [phone]  [facility] [phone]  [facility] [phone] or [phone]   FALL PRECAUTIONS: Be cautious when walking. Scan the area for obstacles that may increase the risk of trips and falls. When getting up in the mornings, sit up at the edge of the bed for a few minutes before getting out of bed. Consider elevating the bed at the head end to avoid drop of blood pressure when getting up. Walk always in a well-lit room (use night lights in the walls). Avoid area rugs or power cords from appliances in the middle of the walkways. Use a walker or a cane if necessary and consider physical therapy for balance exercise. Get your eyesight checked regularly.  FINANCIAL OVERSIGHT: Supervision, especially oversight when making financial decisions or transactions is also recommended.  HOME SAFETY: Consider the safety of the kitchen when operating appliances like stoves, microwave oven, and blender. Consider having supervision and share cooking responsibilities until no longer able to participate in those. Accidents with firearms and other hazards in the house should be identified and addressed as well.   ABILITY TO BE LEFT ALONE: If patient is unable to contact 911 operator, consider using LifeLine, or when the need is there, arrange for someone to stay with patients. Smoking is a fire hazard, consider supervision or cessation. Risk of wandering should be assessed by caregiver and if detected  at any point, supervision and safe proof recommendations should be instituted.  MEDICATION SUPERVISION: Inability to self-administer medication needs to be constantly addressed. Implement a  mechanism to ensure safe administration of the medications.      Mediterranean Diet A Mediterranean diet refers to food and lifestyle choices that are based on the traditions of countries located on the [employer]. This way of eating has been shown to help prevent certain conditions and improve outcomes for people who have chronic diseases, like kidney disease and heart disease. What are tips for following this plan? Lifestyle  Cook and eat meals together with your family, when possible. Drink enough fluid to keep your urine clear or pale yellow. Be physically active every day. This includes: Aerobic exercise like running or swimming. Leisure activities like gardening, walking, or housework. Get 7-8 hours of sleep each night. If recommended by your health care provider, drink red wine in moderation. This means 1 glass a day for nonpregnant women and 2 glasses a day for men. A glass of wine equals 5 oz (150 mL). Reading food labels  Check the serving size of packaged foods. For foods such as rice and pasta, the serving size refers to the amount of cooked product, not dry. Check the total fat in packaged foods. Avoid foods that have saturated fat or trans fats. Check the ingredients list for added sugars, such as corn syrup. Shopping  At the grocery store, buy most of your food from the areas near the walls of the store. This includes: Fresh fruits and vegetables (produce). Grains, beans, nuts, and seeds. Some of these may be available in unpackaged forms or large amounts (in bulk). Fresh seafood. Poultry and eggs. Low-fat dairy products. Buy whole ingredients instead of prepackaged foods. Buy fresh fruits and vegetables in-season from local farmers markets. Buy frozen fruits and vegetables in resealable bags. If you do not have access to quality fresh seafood, buy precooked frozen shrimp or canned fish, such as tuna, salmon, or sardines. Buy small amounts of raw or cooked  vegetables, salads, or olives from the deli or salad bar at your store. Stock your pantry so you always have certain foods on hand, such as olive oil, canned tuna, canned tomatoes, rice, pasta, and beans. Cooking  Cook foods with extra-virgin olive oil instead of using butter or other vegetable oils. Have meat as a side dish, and have vegetables or grains as your main dish. This means having meat in small portions or adding small amounts of meat to foods like pasta or stew. Use beans or vegetables instead of meat in common dishes like chili or lasagna. Experiment with different cooking methods. Try roasting or broiling vegetables instead of steaming or sauteing them. Add frozen vegetables to soups, stews, pasta, or rice. Add nuts or seeds for added healthy fat at each meal. You can add these to yogurt, salads, or vegetable dishes. Marinate fish or vegetables using olive oil, lemon juice, garlic, and fresh herbs. Meal planning  Plan to eat 1 vegetarian meal one day each week. Try to work up to 2 vegetarian meals, if possible. Eat seafood 2 or more times a week. Have healthy snacks readily available, such as: Vegetable sticks with hummus. Greek yogurt. Fruit and nut trail mix. Eat balanced meals throughout the week. This includes: Fruit: 2-3 servings a day Vegetables: 4-5 servings a day Low-fat dairy: 2 servings a day Fish, poultry, or lean meat: 1 serving a day Beans and legumes: 2 or more servings a  week Nuts and seeds: 1-2 servings a day Whole grains: 6-8 servings a day Extra-virgin olive oil: 3-4 servings a day Limit red meat and sweets to only a few servings a month What are my food choices? Mediterranean diet Recommended Grains: Whole-grain pasta. Brown rice. Bulgar wheat. Polenta. Couscous. Whole-wheat bread. [place] Madeira. Vegetables: Artichokes. Beets. Broccoli. Cabbage. Carrots. Eggplant. Green beans. Chard. Kale. Spinach. Onions. Leeks. Peas. Squash. Tomatoes. Peppers.  Radishes. Fruits: Apples. Apricots. Avocado. Berries. Bananas. Cherries. Dates. Figs. Grapes. Lemons. Melon. Oranges. Peaches. Plums. Pomegranate. Meats and other protein foods: Beans. Almonds. Sunflower seeds. Pine nuts. Peanuts. Cod. Salmon. Scallops. Shrimp. Tuna. Tilapia. Clams. Oysters. Eggs. Dairy: Low-fat milk. Cheese. Greek yogurt. Beverages: Water. Red wine. Herbal tea. Fats and oils: Extra virgin olive oil. Avocado oil. Grape seed oil. Sweets and desserts: Greek yogurt with honey. Baked apples. Poached pears. Trail mix. Seasoning and other foods: Basil. Cilantro. Coriander. Cumin. Mint. Parsley. Sage. Rosemary. Tarragon. Garlic. Oregano. Thyme. Pepper. Balsalmic vinegar. Tahini. Hummus. Tomato sauce. Olives. Mushrooms. Limit these Grains: Prepackaged pasta or rice dishes. Prepackaged cereal with added sugar. Vegetables: Deep fried potatoes (french fries). Fruits: Fruit canned in syrup. Meats and other protein foods: Beef. Pork. Lamb. Poultry with skin. Hot dogs. [place]. Dairy: Ice cream. Sour cream. Whole milk. Beverages: Juice. Sugar-sweetened soft drinks. Beer. Liquor and spirits. Fats and oils: Butter. Canola oil. Vegetable oil. Beef fat (tallow). Lard. Sweets and desserts: Cookies. Cakes. Pies. Candy. Seasoning and other foods: Mayonnaise. Premade sauces and marinades. The items listed may not be a complete list. Talk with your dietitian about what dietary choices are right for you. Summary The Mediterranean diet includes both food and lifestyle choices. Eat a variety of fresh fruits and vegetables, beans, nuts, seeds, and whole grains. Limit the amount of red meat and sweets that you eat. Talk with your health care provider about whether it is safe for you to drink red wine in moderation. This means 1 glass a day for nonpregnant women and 2 glasses a day for men. A glass of wine equals 5 oz (150 mL). This information is not intended to replace advice given to you by your health  care provider. Make sure you discuss any questions you have with your health care provider. Document Released: 06/27/2016 Document Revised: 07/30/2016 Document Reviewed: 06/27/2016 Elsevier Interactive Patient Education  2017 [employer].

## 2024-11-09 ENCOUNTER — Encounter (HOSPITAL_COMMUNITY): Payer: Self-pay | Admitting: Internal Medicine

## 2024-11-09 NOTE — Addendum Note (Signed)
 Addended by: DAYNE SHERRY RAMAN on: 11/09/2024 11:06 AM   Modules accepted: Orders

## 2024-11-18 ENCOUNTER — Encounter (HOSPITAL_COMMUNITY): Payer: Self-pay | Admitting: Internal Medicine

## 2024-11-25 ENCOUNTER — Ambulatory Visit (HOSPITAL_COMMUNITY)
Admission: RE | Admit: 2024-11-25 | Discharge: 2024-11-25 | Disposition: A | Discharge status: Home / Self Care | Source: Ambulatory Visit | Attending: Internal Medicine | Admitting: Internal Medicine

## 2024-11-25 VITALS — BP 164/65 | HR 59 | Temp 97.1°F | Resp 16

## 2024-11-25 DIAGNOSIS — M810 Age-related osteoporosis without current pathological fracture: Secondary | ICD-10-CM | POA: Insufficient documentation

## 2024-11-25 MED ORDER — ROMOSOZUMAB-AQQG 105 MG/1.17ML ~~LOC~~ SOSY
PREFILLED_SYRINGE | SUBCUTANEOUS | Status: AC
  Filled 2024-11-25: qty 1.17

## 2024-11-25 MED ORDER — ROMOSOZUMAB-AQQG 105 MG/1.17ML ~~LOC~~ SOSY
210.0000 mg | PREFILLED_SYRINGE | Freq: Once | SUBCUTANEOUS | Status: AC
  Administered 2024-11-25: 210 mg via SUBCUTANEOUS

## 2024-12-27 ENCOUNTER — Inpatient Hospital Stay (HOSPITAL_COMMUNITY): Admission: RE | Admit: 2024-12-27 | Source: Ambulatory Visit

## 2025-01-26 ENCOUNTER — Encounter (HOSPITAL_COMMUNITY)

## 2025-05-09 ENCOUNTER — Ambulatory Visit: Payer: Self-pay | Admitting: Physician Assistant
